# Patient Record
Sex: Female | Born: 1960 | Race: Black or African American | Hispanic: No | Marital: Married | Smoking: Current every day smoker
Health system: Southern US, Community
[De-identification: ages and names within clinical notes are randomized; demographics above are authoritative.]

## PROBLEM LIST (undated history)

## (undated) DIAGNOSIS — M1990 Unspecified osteoarthritis, unspecified site: Secondary | ICD-10-CM

## (undated) HISTORY — PX: APPENDECTOMY: SHX54

## (undated) HISTORY — PX: ABDOMINAL HYSTERECTOMY: SHX81

## (undated) HISTORY — PX: COLONOSCOPY: SHX174

## (undated) HISTORY — DX: Unspecified osteoarthritis, unspecified site: M19.90

---

## 2014-05-10 ENCOUNTER — Other Ambulatory Visit: Payer: Self-pay | Admitting: Obstetrics and Gynecology

## 2014-05-10 DIAGNOSIS — Z1231 Encounter for screening mammogram for malignant neoplasm of breast: Secondary | ICD-10-CM

## 2014-05-25 ENCOUNTER — Encounter (INDEPENDENT_AMBULATORY_CARE_PROVIDER_SITE_OTHER): Payer: Self-pay

## 2014-05-25 ENCOUNTER — Ambulatory Visit
Admission: RE | Admit: 2014-05-25 | Discharge: 2014-05-25 | Disposition: A | Discharge status: Home / Self Care | Payer: PRIVATE HEALTH INSURANCE | Source: Ambulatory Visit | Attending: Obstetrics and Gynecology | Admitting: Obstetrics and Gynecology

## 2014-05-25 DIAGNOSIS — Z1231 Encounter for screening mammogram for malignant neoplasm of breast: Secondary | ICD-10-CM

## 2015-08-22 ENCOUNTER — Encounter (HOSPITAL_COMMUNITY): Payer: Self-pay | Admitting: Emergency Medicine

## 2015-08-22 ENCOUNTER — Ambulatory Visit (HOSPITAL_COMMUNITY)
Admission: EM | Admit: 2015-08-22 | Discharge: 2015-08-22 | Disposition: A | Discharge status: Home / Self Care | Payer: PRIVATE HEALTH INSURANCE | Attending: Family Medicine | Admitting: Family Medicine

## 2015-08-22 DIAGNOSIS — H0014 Chalazion left upper eyelid: Secondary | ICD-10-CM

## 2015-08-22 MED ORDER — ERYTHROMYCIN 5 MG/GM OP OINT: TOPICAL_OINTMENT | OPHTHALMIC | Status: DC

## 2015-08-22 NOTE — Discharge Instructions (Signed)
Chalazion A chalazion is a swelling or lump on the eyelid. It can affect the upper or lower eyelid. CAUSES This condition may be caused by:  Long-lasting (chronic) inflammation of the eyelid glands.  A blocked oil gland in the eyelid. SYMPTOMS Symptoms of this condition include:  A swelling on the eyelid. The swelling may spread to areas around the eye.  A hard lump on the eyelid. This lump may make it hard to see out of the eye. DIAGNOSIS This condition is diagnosed with an examination of the eye. TREATMENT This condition is treated by applying a warm compress to the eyelid. If the condition does not improve after two days, it may be treated with:  Surgery.  Medicine that is injected into the chalazion by a health care provider.  Medicine that is applied to the eye. HOME CARE INSTRUCTIONS  Do not touch the chalazion.  Do not try to remove the pus, such as by squeezing the chalazion or sticking it with a pin or needle.  Do not rub your eyes.  Wash your hands often. Dry your hands with a clean towel.  Keep your face, scalp, and eyebrows clean.  Avoid wearing eye makeup.  Apply a warm, moist compress to the eyelid 4-6 times a day for 10-15 minutes at a time. This will help to open any blocked glands and help to reduce redness and swelling.  Apply over-the-counter and prescription medicines only as told by your health care provider.  If the chalazion does not break open (rupture) on its own in a month, return to your health care provider.  Keep all follow-up appointments as told by your health care provider. This is important. SEEK MEDICAL CARE IF:  Your eyelid has not improved in 4 weeks.  Your eyelid is getting worse.  You have a fever.  The chalazion does not rupture on its own with home treatment in a month. SEEK IMMEDIATE MEDICAL CARE IF:  You have pain in your eye.  Your vision changes.  The chalazion becomes painful or red  The chalazion gets  bigger.   This information is not intended to replace advice given to you by your health care provider. Make sure you discuss any questions you have with your health care provider.   Document Released: 03/22/2000 Document Revised: 12/14/2014 Document Reviewed: 07/18/2014 Elsevier Interactive Patient Education [employer].

## 2015-08-22 NOTE — ED Notes (Signed)
The patient presented to the [facility] with a complaint of left eye drainage and pain x 4 days. The patient stated that she did not injure her eye and just woke up with it irritated and swollen. The patient stated that she has tried eye drops as well as having her eye flushed at work with no relief.

## 2015-08-23 NOTE — ED Provider Notes (Signed)
CSN: [id number]     Arrival date & time 08/22/15  1952 History   First MD Initiated Contact with Patient 08/22/15 2026     Chief Complaint  Patient presents with  . Eye Drainage   (Consider location/radiation/quality/duration/timing/severity/associated sxs/prior Treatment) HPI History obtained from patient:  Pt presents with the cc [id number] EYE PAIN Duration of symptoms:2 DAYS Treatment prior to arrival:COLD COMPRESSES Context:RUBBING EYE NOW WITH PAIN, REDNESS, AND TEARING. SOME BLURRED VISION, DUE TO PAIN.  Other symptoms include: HEADACHE Pain score:4 FAMILY HISTORY:NO FAMILY HISTORY OF CANCER    History reviewed. No pertinent past medical history. Past Surgical History  Procedure Laterality Date  . Abdominal hysterectomy     History reviewed. No pertinent family history. Social History  Substance Use Topics  . Smoking status: Current Every Day Smoker -- 0.50 packs/day for 20 years    Types: Cigarettes  . Smokeless tobacco: None  . Alcohol Use: No   OB History    No data available     Review of Systems  Denies:  NAUSEA, ABDOMINAL PAIN, CHEST PAIN, CONGESTION, DYSURIA, SHORTNESS OF BREATH  Allergies  Review of patient's allergies indicates no known allergies.  Home Medications   Prior to Admission medications   Medication Sig Start Date End Date Taking? Authorizing Provider  erythromycin ophthalmic ointment Place a 1/2 inch ribbon of ointment into the lower eyelid. 08/22/15   Konrad Felix, PA   Meds Ordered and Administered this Visit  Medications - No data to display  BP 120/80 mmHg  Pulse 73  Temp(Src) 98.7 F (37.1 C) (Tympanic)  Resp 16  SpO2 100% No data found.   Physical Exam NURSES NOTES AND VITAL SIGNS REVIEWED. CONSTITUTIONAL: Well developed, well nourished, no acute distress HEENT: normocephalic, atraumatic EYES: Conjunctiva normal, LEFT EYE UPPER LID INTERNAL STYE NOTED. TENDER TO PALPATION, NO FORMAL PUS SACK NOTED. BEDSIDE SNELLEN CHART  IS 20/40 NECK:normal ROM, supple, no adenopathy PULMONARY:No respiratory distress, normal effort ABDOMINAL: Soft, ND, NT BS+, No CVAT MUSCULOSKELETAL: Normal ROM of all extremities,  SKIN: warm and dry without rash PSYCHIATRIC: Mood and affect, behavior are normal  ED Course  Procedures (including critical care time)  Labs Review Labs Reviewed - No data to display  Imaging Review No results found.   Visual Acuity Review  Right Eye Distance: 20/40 Left Eye Distance: Could not see chart Bilateral Distance: 20/40  Right Eye Near:   Left Eye Near:    Bilateral Near:      RX ERYTHROMYCIN OINTMENT  SLIT LAMP NOT NEEDED.  MDM   1. Chalazion of left upper eyelid     Patient is reassured that there are no issues that require transfer to higher level of care at this time or additional tests. Patient is advised to continue home symptomatic treatment. Patient is advised that if there are new or worsening symptoms to attend the emergency department, contact primary care provider, or return to UC. Instructions of care provided discharged home in stable condition.    THIS NOTE WAS GENERATED USING A VOICE RECOGNITION SOFTWARE PROGRAM. ALL REASONABLE EFFORTS  WERE MADE TO PROOFREAD THIS DOCUMENT FOR ACCURACY.  I have verbally reviewed the discharge instructions with the patient. A printed AVS was given to the patient.  All questions were answered prior to discharge.      Konrad Felix, [place] 08/23/15 [phone]

## 2016-03-01 ENCOUNTER — Emergency Department (HOSPITAL_COMMUNITY): Payer: PRIVATE HEALTH INSURANCE

## 2016-03-01 ENCOUNTER — Encounter (HOSPITAL_COMMUNITY): Payer: Self-pay

## 2016-03-01 ENCOUNTER — Emergency Department (HOSPITAL_COMMUNITY)
Admission: EM | Admit: 2016-03-01 | Discharge: 2016-03-01 | Disposition: A | Discharge status: Home / Self Care | Payer: PRIVATE HEALTH INSURANCE | Attending: Emergency Medicine | Admitting: Emergency Medicine

## 2016-03-01 DIAGNOSIS — J069 Acute upper respiratory infection, unspecified: Secondary | ICD-10-CM | POA: Insufficient documentation

## 2016-03-01 DIAGNOSIS — J309 Allergic rhinitis, unspecified: Secondary | ICD-10-CM

## 2016-03-01 DIAGNOSIS — B9789 Other viral agents as the cause of diseases classified elsewhere: Secondary | ICD-10-CM

## 2016-03-01 DIAGNOSIS — F17210 Nicotine dependence, cigarettes, uncomplicated: Secondary | ICD-10-CM | POA: Insufficient documentation

## 2016-03-01 MED ORDER — FLUTICASONE PROPIONATE 50 MCG/ACT NA SUSP: 2.0000 | Freq: Every day | NASAL | 0 refills | Status: DC

## 2016-03-01 MED ORDER — CETIRIZINE HCL 10 MG PO TABS: 10.0000 mg | ORAL_TABLET | Freq: Every day | ORAL | 1 refills | Status: DC

## 2016-03-01 MED ORDER — NAPROXEN 250 MG PO TABS: 250.0000 mg | ORAL_TABLET | Freq: Two times a day (BID) | ORAL | 0 refills | Status: DC

## 2016-03-01 MED ORDER — BENZONATATE 100 MG PO CAPS
100.0000 mg | ORAL_CAPSULE | Freq: Three times a day (TID) | ORAL | 0 refills | Status: DC | PRN
Start: 2016-03-01 — End: 2017-04-22

## 2016-03-01 NOTE — ED Triage Notes (Signed)
Pt reports recent cold with cough symptoms. Pt reports of pain in her back and into her rib cage. Pt reports persistent cough. Voice is hoarse.

## 2016-03-01 NOTE — ED Provider Notes (Signed)
[facility] DEPT Provider Note    By signing my name below, I, Bea Graff, attest that this documentation has been prepared under the direction and in the presence of Will Antia Rahal, PA-C. Electronically Signed: Bea Graff, ED Scribe. 03/01/16. 10:45 AM.    History   Chief Complaint Chief Complaint  Patient presents with  . URI    The history is provided by the patient and medical records. No language interpreter was used.    HPI Comments:  Krystal Rogers is a 55 y.o. female who presents to the Emergency Department complaining of URI symptoms that began two weeks ago. She reports associated hoarseness, sore throat, post nasal drip, cough, wheezing, sneezing, rhinorrhea, chills and myalgias around her ribs when coughing. She denies any known sick contacts. She has taken Mucinex, last dose last night, for her symptoms with no significant relief. Deep breathing and coughing increases the myalgias. She denies alleviating factors.  She denies fever, difficulty breathing or swallowing, CP, otalgia, abdominal pain, nausea, vomiting.   History reviewed. No pertinent past medical history.  There are no active problems to display for this patient.   Past Surgical History:  Procedure Laterality Date  . ABDOMINAL HYSTERECTOMY      OB History    No data available       Home Medications    Prior to Admission medications   Medication Sig Start Date End Date Taking? Authorizing Provider  benzonatate (TESSALON) 100 MG capsule Take 1 capsule (100 mg total) by mouth 3 (three) times daily as needed for cough. 03/01/16   Waynetta Pean, PA-C  cetirizine (ZYRTEC ALLERGY) 10 MG tablet Take 1 tablet (10 mg total) by mouth daily. 03/01/16   Waynetta Pean, PA-C  erythromycin ophthalmic ointment Place a 1/2 inch ribbon of ointment into the lower eyelid. 08/22/15   Konrad Felix, PA  fluticasone (FLONASE) 50 MCG/ACT nasal spray Place 2 sprays into both nostrils daily.  03/01/16   Waynetta Pean, PA-C  naproxen (NAPROSYN) 250 MG tablet Take 1 tablet (250 mg total) by mouth 2 (two) times daily with a meal. 03/01/16   Waynetta Pean, PA-C    Family History History reviewed. No pertinent family history.  Social History Social History  Substance Use Topics  . Smoking status: Current Every Day Smoker    Packs/day: 0.50    Years: 20.00    Types: Cigarettes  . Smokeless tobacco: Never Used  . Alcohol use No     Allergies   Patient has no known allergies.   Review of Systems Review of Systems  Constitutional: Positive for chills. Negative for fever.  HENT: Positive for congestion, postnasal drip, rhinorrhea, sneezing, sore throat and voice change. Negative for ear pain and trouble swallowing.   Eyes: Negative for visual disturbance.  Respiratory: Positive for cough. Negative for shortness of breath and wheezing.   Cardiovascular: Negative for chest pain and palpitations.  Gastrointestinal: Negative for abdominal pain, nausea and vomiting.  Musculoskeletal: Positive for myalgias. Negative for neck pain and neck stiffness.  Skin: Negative for rash.  Neurological: Negative for light-headedness and headaches.     Physical Exam Updated Vital Signs BP (!) 140/116 (BP Location: Left Arm)   Pulse 80   Temp 98.1 F (36.7 C) (Oral)   Resp 19   Ht 5\' 3"  (1.6 m)   Wt 123 lb 6.4 oz (56 kg)   SpO2 100%   BMI 21.86 kg/m   Physical Exam  Constitutional: She is oriented to person, place, and  time. She appears well-developed and well-nourished. No distress.  Nontoxic appearing.  HENT:  Head: Normocephalic and atraumatic.  Right Ear: External ear normal.  Left Ear: External ear normal.  Nose: Mucosal edema present.  Mouth/Throat: No oropharyngeal exudate.  Very boggy nasal turbinates bilaterally. No tonsillar hypertrophy or exudates. Post nasal drip present.  Eyes: Conjunctivae are normal. Pupils are equal, round, and reactive to light. Right eye  exhibits no discharge. Left eye exhibits no discharge.  Neck: Normal range of motion. Neck supple. No JVD present.  Cardiovascular: Normal rate, regular rhythm, normal heart sounds and intact distal pulses.   Pulmonary/Chest: Effort normal and breath sounds normal. No stridor. No respiratory distress. She has no wheezes. She has no rales.  Lungs are clear to auscultation bilaterally. No increased work of breathing. No rales or rhonchi.  Abdominal: Soft. There is no tenderness.  Musculoskeletal: She exhibits no edema.  Lymphadenopathy:    She has no cervical adenopathy.  Neurological: She is alert and oriented to person, place, and time. Coordination normal.  Skin: Skin is warm and dry. Capillary refill takes less than 2 seconds. No rash noted. She is not diaphoretic. No erythema. No pallor.  Psychiatric: She has a normal mood and affect. Her behavior is normal.  Nursing note and vitals reviewed.    ED Treatments / Results  DIAGNOSTIC STUDIES: Oxygen Saturation is 100% on RA, normal by my interpretation.   COORDINATION OF CARE: 10:04 AM- Will order CXR. Pt verbalizes understanding and agrees to plan.  Medications - No data to display  Labs (all labs ordered are listed, but only abnormal results are displayed) Labs Reviewed - No data to display  EKG  EKG Interpretation None       Radiology Dg Chest 2 View  Result Date: 03/01/2016 CLINICAL DATA:  Coughing for 2 weeks. EXAM: CHEST  2 VIEW COMPARISON:  None. FINDINGS: The heart size and mediastinal contours are within normal limits. Both lungs are clear. The visualized skeletal structures are unremarkable. IMPRESSION: No active cardiopulmonary disease. Electronically Signed   By: Fidela Salisbury M.D.   On: 03/01/2016 10:39    Procedures Procedures (including critical care time)  Medications Ordered in ED Medications - No data to display   Initial Impression / Assessment and Plan / ED Course  I have reviewed the  triage vital signs and the nursing notes.  Pertinent labs & imaging results that were available during my care of the patient were reviewed by me and considered in my medical decision making (see chart for details).  Clinical Course    This  is a 55 y.o. female who presents to the Emergency Department complaining of URI symptoms that began two weeks ago. She reports associated hoarseness, sore throat, post nasal drip, cough, wheezing, sneezing, rhinorrhea, chills and myalgias around her ribs when coughing. No fevers, chest pain or shortness of breath. On exam the patient is afebrile and nontoxic appearing. Lungs are clear to auscultation bilaterally. Evidence of URI. Pt symptoms consistent with URI symptoms that began two weeks ago. CXR negative for acute infiltrate. Pt will be discharged with symptomatic treatment.  Discussed return precautions.  Pt is hemodynamically stable & in NAD prior to discharge. I advised the patient to follow-up with their primary care provider this week. I advised the patient to return to the emergency department with new or worsening symptoms or new concerns. The patient verbalized understanding and agreement with plan.      I personally performed the services described in  this documentation, which was scribed in my presence. The recorded information has been reviewed and is accurate.     Final Clinical Impressions(s) / ED Diagnoses   Final diagnoses:  Viral URI with cough  Acute allergic rhinitis, unspecified seasonality, unspecified trigger    New Prescriptions New Prescriptions   BENZONATATE (TESSALON) 100 MG CAPSULE    Take 1 capsule (100 mg total) by mouth 3 (three) times daily as needed for cough.   CETIRIZINE (ZYRTEC ALLERGY) 10 MG TABLET    Take 1 tablet (10 mg total) by mouth daily.   FLUTICASONE (FLONASE) 50 MCG/ACT NASAL SPRAY    Place 2 sprays into both nostrils daily.   NAPROXEN (NAPROSYN) 250 MG TABLET    Take 1 tablet (250 mg total) by mouth 2  (two) times daily with a meal.     Waynetta Pean, PA-C 03/01/16 [place], MD 03/02/16 [phone]

## 2017-03-28 ENCOUNTER — Encounter (HOSPITAL_COMMUNITY): Payer: Self-pay | Admitting: Emergency Medicine

## 2017-03-28 ENCOUNTER — Ambulatory Visit (HOSPITAL_COMMUNITY)
Admission: EM | Admit: 2017-03-28 | Discharge: 2017-03-28 | Disposition: A | Discharge status: Home / Self Care | Payer: PRIVATE HEALTH INSURANCE | Attending: Internal Medicine | Admitting: Internal Medicine

## 2017-03-28 ENCOUNTER — Telehealth (HOSPITAL_COMMUNITY): Payer: Self-pay | Admitting: Emergency Medicine

## 2017-03-28 DIAGNOSIS — M25561 Pain in right knee: Secondary | ICD-10-CM | POA: Diagnosis not present

## 2017-03-28 MED ORDER — NAPROXEN 250 MG PO TABS: 250.0000 mg | ORAL_TABLET | Freq: Two times a day (BID) | ORAL | 0 refills | Status: DC

## 2017-03-28 NOTE — Discharge Instructions (Signed)
Please take naproxen twice a day, take with food. May take 500mg  at night if pain more severe. Ice and elevate knee, especially after work. Knee sleeve for support, especially while at work. Please see provided exercises that may help strengthen knee. Please follow up with primary care or with orthopedics if pain persists and/or worsens in the next 2-4 weeks.

## 2017-03-28 NOTE — Telephone Encounter (Signed)
Per Janee Morn, NP.... Ok to call in Naproxen 250 MG BID  #14 x7 days no refills.   Sent to [facility])

## 2017-03-28 NOTE — ED Triage Notes (Signed)
PT C/O: intermittent right knee pain ... Reports its getting worse .... Pain increases w/activity   ONSET: 2 weeks   DENIES: inj/trauma  TAKING MEDS: none   A&O x4... NAD... Ambulatory

## 2017-03-28 NOTE — ED Provider Notes (Signed)
[place]    CSN: [id number] Arrival date & time: 03/28/17  1022     History   Chief Complaint Chief Complaint  Patient presents with  . Knee Pain    HPI Krystal Rogers is a 56 y.o. female.   Krystal Rogers presents with complaints of right knee pain which has worsened over the past few days. This has been ongoing for a few weeks. She works on her feet which does seem to exacerbate the pain. Pain worse this morning when she woke, rated it 10/10, now is a 7/10. Is sharp. Worse with knee flexion. No known injury to the knee. She does take aleve approximately once a day which seems to help. She had a tibial fracture as a child with screws placed. Has not since followed with orthopedics or had any other known injuries or surgeries to the knee. Does not take any medications regularly.   ROS per HPI.       History reviewed. No pertinent past medical history.  There are no active problems to display for this patient.   Past Surgical History:  Procedure Laterality Date  . ABDOMINAL HYSTERECTOMY      OB History    No data available       Home Medications    Prior to Admission medications   Medication Sig Start Date End Date Taking? Authorizing Provider  benzonatate (TESSALON) 100 MG capsule Take 1 capsule (100 mg total) by mouth 3 (three) times daily as needed for cough. 03/01/16   Waynetta Pean, PA-C  cetirizine (ZYRTEC ALLERGY) 10 MG tablet Take 1 tablet (10 mg total) by mouth daily. 03/01/16   Waynetta Pean, PA-C  erythromycin ophthalmic ointment Place a 1/2 inch ribbon of ointment into the lower eyelid. 08/22/15   Konrad Felix, PA  fluticasone (FLONASE) 50 MCG/ACT nasal spray Place 2 sprays into both nostrils daily. 03/01/16   Waynetta Pean, PA-C  naproxen (NAPROSYN) 250 MG tablet Take 1 tablet (250 mg total) by mouth 2 (two) times daily with a meal. 03/01/16   Waynetta Pean, PA-C    Family History History reviewed. No pertinent family  history.  Social History Social History   Tobacco Use  . Smoking status: Current Every Day Smoker    Packs/day: 0.50    Years: 20.00    Pack years: 10.00    Types: Cigarettes  . Smokeless tobacco: Never Used  Substance Use Topics  . Alcohol use: No  . Drug use: Not on file     Allergies   Madelaine Bhat isothiocyanate]   Review of Systems Review of Systems   Physical Exam Triage Vital Signs ED Triage Vitals  Enc Vitals Group     BP 03/28/17 1103 103/60     Pulse Rate 03/28/17 1103 76     Resp 03/28/17 1103 18     Temp 03/28/17 1103 98.3 F (36.8 C)     Temp Source 03/28/17 1103 Oral     SpO2 03/28/17 1103 100 %     Weight --      Height --      Head Circumference --      Peak Flow --      Pain Score 03/28/17 1101 8     Pain Loc --      Pain Edu? --      Excl. in [place]? --    No data found.  Updated Vital Signs BP 103/60 (BP Location: Left Arm)   Pulse 76   Temp  98.3 F (36.8 C) (Oral)   Resp 18   SpO2 100%   Visual Acuity Right Eye Distance:   Left Eye Distance:   Bilateral Distance:    Right Eye Near:   Left Eye Near:    Bilateral Near:     Physical Exam  Constitutional: She is oriented to person, place, and time. She appears well-developed and well-nourished. No distress.  Cardiovascular: Normal rate, regular rhythm and normal heart sounds.  Pulmonary/Chest: Effort normal and breath sounds normal.  Musculoskeletal:       Right knee: She exhibits normal range of motion, no swelling, no effusion, no ecchymosis, no deformity, no laceration, no erythema, normal alignment, no LCL laxity, normal patellar mobility and no bony tenderness. Tenderness found. Medial joint line and MCL tenderness noted.  Pain with complete passive flexion at medial joint; tenderness to MCL at tibial insertion; pain with MCL tension; equal strength to bilateral lower extremities; sensation intact; ambulatory  Neurological: She is alert and oriented to person, place, and time.   Skin: Skin is warm and dry.     UC Treatments / Results  Labs (all labs ordered are listed, but only abnormal results are displayed) Labs Reviewed - No data to display  EKG  EKG Interpretation None       Radiology No results found.  Procedures Procedures (including critical care time)  Medications Ordered in UC Medications - No data to display   Initial Impression / Assessment and Plan / UC Course  I have reviewed the triage vital signs and the nursing notes.  Pertinent labs & imaging results that were available during my care of the patient were reviewed by me and considered in my medical decision making (see chart for details).     Knee pain likely related to MCL based on exam. Continue with naproxen, knee sleeve. Follow up with orthopedics and/or PCP as needed for persistent or worsening of symptoms. Patient verbalized understanding and agreeable to plan.  Ambulatory out of clinic without difficulty.    Final Clinical Impressions(s) / UC Diagnoses   Final diagnoses:  Right knee pain, unspecified chronicity    ED Discharge Orders    None       Controlled Substance Prescriptions  Controlled Substance Registry consulted? Not Applicable   Zigmund Gottron, NP 03/28/17 1206

## 2017-04-22 ENCOUNTER — Encounter: Payer: Self-pay | Admitting: Nurse Practitioner

## 2017-04-22 ENCOUNTER — Ambulatory Visit: Payer: PRIVATE HEALTH INSURANCE | Attending: Nurse Practitioner | Admitting: Nurse Practitioner

## 2017-04-22 VITALS — BP 109/72 | HR 79 | Temp 98.3°F | Ht 63.0 in | Wt 119.8 lb

## 2017-04-22 DIAGNOSIS — Z79899 Other long term (current) drug therapy: Secondary | ICD-10-CM | POA: Insufficient documentation

## 2017-04-22 DIAGNOSIS — Z1231 Encounter for screening mammogram for malignant neoplasm of breast: Secondary | ICD-10-CM | POA: Diagnosis not present

## 2017-04-22 DIAGNOSIS — Z1211 Encounter for screening for malignant neoplasm of colon: Secondary | ICD-10-CM

## 2017-04-22 DIAGNOSIS — R238 Other skin changes: Secondary | ICD-10-CM | POA: Insufficient documentation

## 2017-04-22 DIAGNOSIS — R634 Abnormal weight loss: Secondary | ICD-10-CM | POA: Insufficient documentation

## 2017-04-22 DIAGNOSIS — M25561 Pain in right knee: Secondary | ICD-10-CM

## 2017-04-22 DIAGNOSIS — Z0000 Encounter for general adult medical examination without abnormal findings: Secondary | ICD-10-CM | POA: Diagnosis not present

## 2017-04-22 MED ORDER — DICLOFENAC SODIUM 1 % TD GEL: 2.0000 g | Freq: Four times a day (QID) | TRANSDERMAL | 0 refills | Status: DC

## 2017-04-22 NOTE — Patient Instructions (Addendum)

## 2017-04-22 NOTE — Progress Notes (Signed)
Assessment & Plan:  Krystal Rogers was seen today for hospitalization follow-up.  Diagnoses and all orders for this visit:  Acute pain of right knee -     diclofenac sodium (VOLTAREN) 1 % GEL; Apply 2 g topically 4 (four) times daily. RICE-rest limb as often as possible Ice, compression and elevation for swelling  Other skin changes -     Ambulatory referral to Dermatology  Routine adult health maintenance -     CBC -     Basic metabolic panel -     Lipid panel -     VITAMIN D 25 Hydroxy (Vit-D Deficiency, Fractures)  Weight loss -     TSH  Colon cancer screening -     Ambulatory referral to Gastroenterology  Breast cancer screening by mammogram -     MM DIGITAL SCREENING BILATERAL; Future    Patient has been counseled on age-appropriate routine health concerns for screening and prevention. These are reviewed and up-to-date. Referrals have been placed accordingly. Immunizations are up-to-date or declined.    Subjective:   Chief Complaint  Patient presents with  . Hospitalization Follow-up    Patient is here for hospitalization for right knee pain . Patient stated it throbs and ache more at night when she sleep. Patient stated the pain been going on for a month already. Patient stated after she was admitted from the hospital, her knee pain got better.    HPI Krystal Rogers 57 y.o. female presents to office today to establish care as a new patient and for follow up to right knee pain.   Right Knee Pain Seen at the Urgent Care for right knee pain on 03-28-2017. At that time knee pain had been ongoing for a few weeks. No imaging was performed as patient did not report any recent injury to her knee. She was prescribed Naproxen and referred to Orthopedics and instructed to obtain a PCP as well.  Pain was sudden onset with swelling. Pain is rated as 4/10 during the day and 8/10 at night when she is lying in the bed. She denies any injury to knee. Current symptoms include pain  located behind the patellar area, stiffness and swelling. Pain is aggravated by driving (pushing pedals in the car; flexion).  Treatment to date:icy hot and arthritis cream with little to no relief of symptoms.   Weight Loss She has lost about 20 lbs since this summer. She also reports very poor dentition with heat and cold sensitivity. She also lost several teeth over the summer and a few months ago had the remainder of her teeth pulled. She is currently waiting for her dentures. She has had to eliminate many of the foods she loves to eat due to not being able to chew properly. This may likely be the cause of her weight loss. She denies any loss of appetite. Will monitor for now and order labs today.    Review of Systems  Constitutional: Positive for weight loss. Negative for fever and malaise/fatigue.  HENT: Negative.  Negative for nosebleeds.   Eyes: Negative.  Negative for blurred vision, double vision and photophobia.  Respiratory: Negative.  Negative for cough and shortness of breath.   Cardiovascular: Negative.  Negative for chest pain, palpitations and leg swelling.  Gastrointestinal: Negative.  Negative for abdominal pain, constipation, diarrhea, heartburn, nausea and vomiting.  Musculoskeletal: Positive for joint pain. Negative for myalgias.       See hpi  Skin:       See hpi  Neurological: Negative.  Negative for dizziness, focal weakness, seizures, weakness and headaches.  Endo/Heme/Allergies: Negative for environmental allergies.  Psychiatric/Behavioral: Negative.  Negative for suicidal ideas.    History reviewed. No pertinent past medical history.  Past Surgical History:  Procedure Laterality Date  . ABDOMINAL HYSTERECTOMY      Family History  Problem Relation Age of Onset  . Cancer Father   . Cancer Brother     Social History Reviewed with no changes to be made today.   Outpatient Medications Prior to Visit  Medication Sig Dispense Refill  . cetirizine (ZYRTEC  ALLERGY) 10 MG tablet Take 1 tablet (10 mg total) by mouth daily. (Patient not taking: Reported on 04/22/2017) 30 tablet 1  . erythromycin ophthalmic ointment Place a 1/2 inch ribbon of ointment into the lower eyelid. (Patient not taking: Reported on 04/22/2017) 3.5 g 1  . fluticasone (FLONASE) 50 MCG/ACT nasal spray Place 2 sprays into both nostrils daily. (Patient not taking: Reported on 04/22/2017) 16 g 0  . benzonatate (TESSALON) 100 MG capsule Take 1 capsule (100 mg total) by mouth 3 (three) times daily as needed for cough. (Patient not taking: Reported on 04/22/2017) 21 capsule 0  . naproxen (NAPROSYN) 250 MG tablet Take 1 tablet (250 mg total) by mouth 2 (two) times daily with a meal. (Patient not taking: Reported on 04/22/2017) 14 tablet 0   No facility-administered medications prior to visit.     Allergies  Allergen Reactions  . Madelaine Bhat Isothiocyanate] Hives       Objective:    BP 109/72 (BP Location: Left Arm, Patient Position: Sitting, Cuff Size: Normal)   Pulse 79   Temp 98.3 F (36.8 C) (Oral)   Ht 5\' 3"  (1.6 m)   Wt 119 lb 12.8 oz (54.3 kg)   SpO2 99%   BMI 21.22 kg/m  Wt Readings from Last 3 Encounters:  04/22/17 119 lb 12.8 oz (54.3 kg)  03/01/16 123 lb 6.4 oz (56 kg)    Physical Exam  Constitutional: She is oriented to person, place, and time. She appears well-developed and well-nourished. She is cooperative.  HENT:  Head: Normocephalic and atraumatic.  Eyes: EOM are normal.  Neck: Normal range of motion.  Cardiovascular: Normal rate, regular rhythm, normal heart sounds and intact distal pulses. Exam reveals no gallop and no friction rub.  No murmur heard. Pulmonary/Chest: Effort normal and breath sounds normal. No tachypnea. No respiratory distress. She has no decreased breath sounds. She has no wheezes. She has no rhonchi. She has no rales. She exhibits no tenderness.  Abdominal: Soft. Bowel sounds are normal.  Musculoskeletal: Normal range of motion. She  exhibits no edema, tenderness or deformity.  Neurological: She is alert and oriented to person, place, and time. Coordination normal.  Skin: Skin is warm and dry.     Skin changes  Psychiatric: She has a normal mood and affect. Her behavior is normal. Judgment and thought content normal.  Nursing note and vitals reviewed.     Patient has been counseled extensively about nutrition and exercise as well as the importance of adherence with medications and regular follow-up. The patient was given clear instructions to go to ER or return to medical center if symptoms don't improve, worsen or new problems develop. The patient verbalized understanding.   Follow-up: Return in about 2 weeks (around 05/06/2017) for schedule for pap and physical .   Gildardo Pounds, FNP-BC [facility] [place], [place]   04/22/2017,  11:15 AM

## 2017-04-23 ENCOUNTER — Telehealth: Payer: Self-pay

## 2017-04-23 LAB — BASIC METABOLIC PANEL
BUN / CREAT RATIO: 17 (ref 9–23)
BUN: 12 mg/dL (ref 6–24)
CO2: 22 mmol/L (ref 20–29)
CREATININE: 0.7 mg/dL (ref 0.57–1.00)
Calcium: 9.2 mg/dL (ref 8.7–10.2)
Chloride: 105 mmol/L (ref 96–106)
GFR calc non Af Amer: 99 mL/min/{1.73_m2} (ref >59)
GFR, EST AFRICAN AMERICAN: 114 mL/min/{1.73_m2} (ref >59)
GLUCOSE: 79 mg/dL (ref 65–99)
Potassium: 4.5 mmol/L (ref 3.5–5.2)
SODIUM: 142 mmol/L (ref 134–144)

## 2017-04-23 LAB — CBC
HEMOGLOBIN: 14.7 g/dL (ref 11.1–15.9)
Hematocrit: 45.2 % (ref 34.0–46.6)
MCH: 28.7 pg (ref 26.6–33.0)
MCHC: 32.5 g/dL (ref 31.5–35.7)
MCV: 88 fL (ref 79–97)
PLATELETS: 249 10*3/uL (ref 150–379)
RBC: 5.12 x10E6/uL (ref 3.77–5.28)
RDW: 13.5 % (ref 12.3–15.4)
WBC: 9.9 10*3/uL (ref 3.4–10.8)

## 2017-04-23 LAB — VITAMIN D 25 HYDROXY (VIT D DEFICIENCY, FRACTURES): Vit D, 25-Hydroxy: 24.1 ng/mL — ABNORMAL LOW (ref 30.0–100.0)

## 2017-04-23 LAB — LIPID PANEL
Chol/HDL Ratio: 4 ratio (ref 0.0–4.4)
Cholesterol, Total: 186 mg/dL (ref 100–199)
HDL: 46 mg/dL (ref >39)
LDL CALC: 127 mg/dL — AB (ref 0–99)
Triglycerides: 65 mg/dL (ref 0–149)
VLDL CHOLESTEROL CAL: 13 mg/dL (ref 5–40)

## 2017-04-23 LAB — SPECIMEN STATUS REPORT

## 2017-04-23 LAB — TSH: TSH: 0.906 u[IU]/mL (ref 0.450–4.500)

## 2017-04-23 NOTE — Telephone Encounter (Signed)
-----   Message from Gildardo Pounds, NP sent at 04/23/2017  9:00 AM EST ----- LDL which is a form of cholesterol is slightly elevated. Work on a low fat, heart healthy diet and participate in regular aerobic exercise program to control as well by working out at least 150 minutes per week. No fried foods. No junk foods, sodas, sugary drinks, unhealthy snacking, or smoking. Also your TSH which is your thyroid hormone indicator is slightly low normal; I am ordering additional blood work for evaluation. Vitamin D is below normal. Please make sure you take at least 2000units of vitamin D daily over the counter.

## 2017-04-23 NOTE — Addendum Note (Signed)
Addended by: Geryl Rankins on: 04/23/2017 09:02 AM   Modules accepted: Orders

## 2017-04-24 ENCOUNTER — Ambulatory Visit (INDEPENDENT_AMBULATORY_CARE_PROVIDER_SITE_OTHER): Payer: No Typology Code available for payment source | Admitting: Orthopaedic Surgery

## 2017-04-24 ENCOUNTER — Ambulatory Visit (INDEPENDENT_AMBULATORY_CARE_PROVIDER_SITE_OTHER): Payer: No Typology Code available for payment source

## 2017-04-24 DIAGNOSIS — M23203 Derangement of unspecified medial meniscus due to old tear or injury, right knee: Secondary | ICD-10-CM

## 2017-04-24 LAB — T3
T3 TOTAL: 124 ng/dL (ref 71–180)
T3, Total: 124 ng/dL (ref 71–180)

## 2017-04-24 LAB — T4, FREE
FREE T4: 1.2 ng/dL (ref 0.82–1.77)
Free T4: 1.2 ng/dL (ref 0.82–1.77)

## 2017-04-24 LAB — SPECIMEN STATUS REPORT

## 2017-04-24 MED ORDER — METHYLPREDNISOLONE ACETATE 40 MG/ML IJ SUSP
40.0000 mg | INTRAMUSCULAR | Status: AC | PRN
  Administered 2017-04-24: 40 mg via INTRA_ARTICULAR

## 2017-04-24 MED ORDER — LIDOCAINE HCL 1 % IJ SOLN
2.0000 mL | INTRAMUSCULAR | Status: AC | PRN
  Administered 2017-04-24: 2 mL

## 2017-04-24 MED ORDER — BUPIVACAINE HCL 0.25 % IJ SOLN
2.0000 mL | INTRAMUSCULAR | Status: AC | PRN
  Administered 2017-04-24: 2 mL via INTRA_ARTICULAR

## 2017-04-24 NOTE — Progress Notes (Signed)
Office Visit Note   Patient: Krystal Rogers           Date of Birth: 27-Jun-1960           MRN: [id number] Visit Date: 04/24/2017              Requested by: No referring provider defined for this encounter. PCP: Gildardo Pounds, NP   Assessment & Plan: Visit Diagnoses:  1. Degenerative tear of medial meniscus of right knee     Plan: Impression is right knee degenerative medial meniscus tear.  At this point I feel is appropriate to proceed with a right knee intra-articular cortisone injection to see if we get some relief of pain.  If she is not any better, she will call us in 2 weeks time and we will get an MRI to assess her medial meniscus.  Follow-Up Instructions: Return if symptoms worsen or fail to improve.   Orders:  Orders Placed This Encounter  Procedures  . Large Joint Inj: R knee  . XR Knee 1-2 Views Right   No orders of the defined types were placed in this encounter.     Procedures: Large Joint Inj: R knee on 04/24/2017 9:35 AM Indications: pain Details: 22 G needle, anterolateral approach Medications: 2 mL lidocaine 1 %; 2 mL bupivacaine 0.25 %; 40 mg methylPREDNISolone acetate 40 MG/ML      Clinical Data: No additional findings.   Subjective: Chief Complaint  Patient presents with  . Right Knee - Pain    HPI Krystal Rogers is a pleasant 57 year old female new patient who presents to our clinic today with right knee pain.  This began approximately 2 months ago without any known injury or change in activity.  All of her pain is medial aspect.  No locking, catching or instability.  It is worse with knee extension as well as driving.  She was seen in urgent care center on 03/28/2017 she was given a knee sleeve and NSAIDs.  These have helped her knee slightly.  She works on her feet daily.  No previous cortisone injection or history of gout.  Of note she does have a history surgery from a tibia fracture as a kid.  Review of Systems as detailed in HPI.   All others reviewed and are negative.   Objective: Vital Signs: There were no vitals taken for this visit.  Physical Exam well-developed well-nourished female no acute distress.  Alert and oriented x3.  Ortho Exam examination of her right knee reveals no effusion.  Range of motion 0-110 degrees.  Marked tenderness medial joint line over the medial meniscus.  Positive medial McMurray.  Minimal patellofemoral crepitus.  No tenderness over the pes bursa.  She is stable valgus and varus stress.  She is nervous intact distally.  Specialty Comments:  No specialty comments available.  Imaging: Xr Knee 1-2 Views Right  Result Date: 04/24/2017 2 views of the right knee reveal minimal tricompartmental degenerative changes    PMFS History: There are no active problems to display for this patient.  No past medical history on file.  Family History  Problem Relation Age of Onset  . Cancer Father   . Cancer Brother     Past Surgical History:  Procedure Laterality Date  . ABDOMINAL HYSTERECTOMY     Social History   Occupational History  . Not on file  Tobacco Use  . Smoking status: Current Every Day Smoker    Packs/day: 0.50    Years: 20.00  Pack years: 10.00    Types: Cigarettes  . Smokeless tobacco: Never Used  Substance and Sexual Activity  . Alcohol use: No  . Drug use: No  . Sexual activity: Yes

## 2017-04-24 NOTE — Telephone Encounter (Signed)
CMA attempt to call patient regarding about lab result.   Patient allow to leave a detail message on [phone].  Left a callback number for patient to call back.

## 2017-04-24 NOTE — Telephone Encounter (Signed)
-----   Message from Gildardo Pounds, NP sent at 04/23/2017  9:00 AM EST ----- LDL which is a form of cholesterol is slightly elevated. Work on a low fat, heart healthy diet and participate in regular aerobic exercise program to control as well by working out at least 150 minutes per week. No fried foods. No junk foods, sodas, sugary drinks, unhealthy snacking, or smoking. Also your TSH which is your thyroid hormone indicator is slightly low normal; I am ordering additional blood work for evaluation. Vitamin D is below normal. Please make sure you take at least 2000units of vitamin D daily over the counter.

## 2017-04-28 ENCOUNTER — Telehealth: Payer: Self-pay

## 2017-04-28 NOTE — Telephone Encounter (Signed)
-----   Message from Gildardo Pounds, NP sent at 04/28/2017  1:00 AM EST ----- Thyroid hormone level is normal

## 2017-04-28 NOTE — Telephone Encounter (Signed)
CMA called patient regarding lab result.  Patient understood and verified DOB.

## 2017-04-28 NOTE — Progress Notes (Signed)
Patient asked if she need to make a lab appointment for the additional blood work?

## 2017-04-30 ENCOUNTER — Telehealth: Payer: Self-pay

## 2017-04-30 NOTE — Telephone Encounter (Signed)
CMA called patient to informed her she do not need to schedule for an additional blood work.   Patient understood.

## 2017-04-30 NOTE — Telephone Encounter (Signed)
-----   Message from Gildardo Pounds, NP sent at 04/28/2017 11:15 PM EST ----- NO her additional tests were normal. Thank you ----- Message ----- From: Mariane Baumgarten, CMA Sent: 04/28/2017   9:16 AM To: Gildardo Pounds, NP  Patient asked if she need to make a lab appointment for the additional blood work?

## 2017-05-06 ENCOUNTER — Encounter: Payer: Self-pay | Admitting: Nurse Practitioner

## 2017-05-06 ENCOUNTER — Ambulatory Visit: Payer: PRIVATE HEALTH INSURANCE | Attending: Nurse Practitioner | Admitting: Nurse Practitioner

## 2017-05-06 VITALS — BP 99/62 | HR 78 | Temp 98.4°F | Ht 63.0 in | Wt 119.4 lb

## 2017-05-06 DIAGNOSIS — Z01419 Encounter for gynecological examination (general) (routine) without abnormal findings: Secondary | ICD-10-CM | POA: Diagnosis not present

## 2017-05-06 DIAGNOSIS — Z716 Tobacco abuse counseling: Secondary | ICD-10-CM | POA: Diagnosis not present

## 2017-05-06 DIAGNOSIS — Z79899 Other long term (current) drug therapy: Secondary | ICD-10-CM | POA: Insufficient documentation

## 2017-05-06 DIAGNOSIS — F17210 Nicotine dependence, cigarettes, uncomplicated: Secondary | ICD-10-CM | POA: Diagnosis not present

## 2017-05-06 DIAGNOSIS — F17200 Nicotine dependence, unspecified, uncomplicated: Secondary | ICD-10-CM | POA: Insufficient documentation

## 2017-05-06 NOTE — Patient Instructions (Addendum)
Health Maintenance for Postmenopausal Women Menopause is a normal process in which your reproductive ability comes to an end. This process happens gradually over a span of months to years, usually between the ages of 22 and 9. Menopause is complete when you have missed 12 consecutive menstrual periods. It is important to talk with your health care provider about some of the most common conditions that affect postmenopausal women, such as heart disease, cancer, and bone loss (osteoporosis). Adopting a healthy lifestyle and getting preventive care can help to promote your health and wellness. Those actions can also lower your chances of developing some of these common conditions. What should I know about menopause? During menopause, you may experience a number of symptoms, such as:  Moderate-to-severe hot flashes.  Night sweats.  Decrease in sex drive.  Mood swings.  Headaches.  Tiredness.  Irritability.  Memory problems.  Insomnia.  Choosing to treat or not to treat menopausal changes is an individual decision that you make with your health care provider. What should I know about hormone replacement therapy and supplements? Hormone therapy products are effective for treating symptoms that are associated with menopause, such as hot flashes and night sweats. Hormone replacement carries certain risks, especially as you become older. If you are thinking about using estrogen or estrogen with progestin treatments, discuss the benefits and risks with your health care provider. What should I know about heart disease and stroke? Heart disease, heart attack, and stroke become more likely as you age. This may be due, in part, to the hormonal changes that your body experiences during menopause. These can affect how your body processes dietary fats, triglycerides, and cholesterol. Heart attack and stroke are both medical emergencies. There are many things that you can do to help prevent heart disease  and stroke:  Have your blood pressure checked at least every 1-2 years. High blood pressure causes heart disease and increases the risk of stroke.  If you are 53-22 years old, ask your health care provider if you should take aspirin to prevent a heart attack or a stroke.  Do not use any tobacco products, including cigarettes, chewing tobacco, or electronic cigarettes. If you need help quitting, ask your health care provider.  It is important to eat a healthy diet and maintain a healthy weight. ? Be sure to include plenty of vegetables, fruits, low-fat dairy products, and lean protein. ? Avoid eating foods that are high in solid fats, added sugars, or salt (sodium).  Get regular exercise. This is one of the most important things that you can do for your health. ? Try to exercise for at least 150 minutes each week. The type of exercise that you do should increase your heart rate and make you sweat. This is known as moderate-intensity exercise. ? Try to do strengthening exercises at least twice each week. Do these in addition to the moderate-intensity exercise.  Know your numbers.Ask your health care provider to check your cholesterol and your blood glucose. Continue to have your blood tested as directed by your health care provider.  What should I know about cancer screening? There are several types of cancer. Take the following steps to reduce your risk and to catch any cancer development as early as possible. Breast Cancer  Practice breast self-awareness. ? This means understanding how your breasts normally appear and feel. ? It also means doing regular breast self-exams. Let your health care provider know about any changes, no matter how small.  If you are 40  or older, have a clinician do a breast exam (clinical breast exam or CBE) every year. Depending on your age, family history, and medical history, it may be recommended that you also have a yearly breast X-ray (mammogram).  If you  have a family history of breast cancer, talk with your health care provider about genetic screening.  If you are at high risk for breast cancer, talk with your health care provider about having an MRI and a mammogram every year.  Breast cancer (BRCA) gene test is recommended for women who have family members with BRCA-related cancers. Results of the assessment will determine the need for genetic counseling and BRCA1 and for BRCA2 testing. BRCA-related cancers include these types: ? Breast. This occurs in males or females. ? Ovarian. ? Tubal. This may also be called fallopian tube cancer. ? Cancer of the abdominal or pelvic lining (peritoneal cancer). ? Prostate. ? Pancreatic.  Cervical, Uterine, and Ovarian Cancer Your health care provider may recommend that you be screened regularly for cancer of the pelvic organs. These include your ovaries, uterus, and vagina. This screening involves a pelvic exam, which includes checking for microscopic changes to the surface of your cervix (Pap test).  For women ages 21-65, health care providers may recommend a pelvic exam and a Pap test every three years. For women ages 79-65, they may recommend the Pap test and pelvic exam, combined with testing for human papilloma virus (HPV), every five years. Some types of HPV increase your risk of cervical cancer. Testing for HPV may also be done on women of any age who have unclear Pap test results.  Other health care providers may not recommend any screening for nonpregnant women who are considered low risk for pelvic cancer and have no symptoms. Ask your health care provider if a screening pelvic exam is right for you.  If you have had past treatment for cervical cancer or a condition that could lead to cancer, you need Pap tests and screening for cancer for at least 20 years after your treatment. If Pap tests have been discontinued for you, your risk factors (such as having a new sexual partner) need to be  reassessed to determine if you should start having screenings again. Some women have medical problems that increase the chance of getting cervical cancer. In these cases, your health care provider may recommend that you have screening and Pap tests more often.  If you have a family history of uterine cancer or ovarian cancer, talk with your health care provider about genetic screening.  If you have vaginal bleeding after reaching menopause, tell your health care provider.  There are currently no reliable tests available to screen for ovarian cancer.  Lung Cancer Lung cancer screening is recommended for adults 69-62 years old who are at high risk for lung cancer because of a history of smoking. A yearly low-dose CT scan of the lungs is recommended if you:  Currently smoke.  Have a history of at least 30 pack-years of smoking and you currently smoke or have quit within the past 15 years. A pack-year is smoking an average of one pack of cigarettes per day for one year.  Yearly screening should:  Continue until it has been 15 years since you quit.  Stop if you develop a health problem that would prevent you from having lung cancer treatment.  Colorectal Cancer  This type of cancer can be detected and can often be prevented.  Routine colorectal cancer screening usually begins at  age 42 and continues through age 45.  If you have risk factors for colon cancer, your health care provider may recommend that you be screened at an earlier age.  If you have a family history of colorectal cancer, talk with your health care provider about genetic screening.  Your health care provider may also recommend using home test kits to check for hidden blood in your stool.  A small camera at the end of a tube can be used to examine your colon directly (sigmoidoscopy or colonoscopy). This is done to check for the earliest forms of colorectal cancer.  Direct examination of the colon should be repeated every  5-10 years until age 71. However, if early forms of precancerous polyps or small growths are found or if you have a family history or genetic risk for colorectal cancer, you may need to be screened more often.  Skin Cancer  Check your skin from head to toe regularly.  Monitor any moles. Be sure to tell your health care provider: ? About any new moles or changes in moles, especially if there is a change in a mole's shape or color. ? If you have a mole that is larger than the size of a pencil eraser.  If any of your family members has a history of skin cancer, especially at a young age, talk with your health care provider about genetic screening.  Always use sunscreen. Apply sunscreen liberally and repeatedly throughout the day.  Whenever you are outside, protect yourself by wearing long sleeves, pants, a wide-brimmed hat, and sunglasses.  What should I know about osteoporosis? Osteoporosis is a condition in which bone destruction happens more quickly than new bone creation. After menopause, you may be at an increased risk for osteoporosis. To help prevent osteoporosis or the bone fractures that can happen because of osteoporosis, the following is recommended:  If you are 46-71 years old, get at least 1,000 mg of calcium and at least 600 mg of vitamin D per day.  If you are older than age 55 but younger than age 65, get at least 1,200 mg of calcium and at least 600 mg of vitamin D per day.  If you are older than age 54, get at least 1,200 mg of calcium and at least 800 mg of vitamin D per day.  Smoking and excessive alcohol intake increase the risk of osteoporosis. Eat foods that are rich in calcium and vitamin D, and do weight-bearing exercises several times each week as directed by your health care provider. What should I know about how menopause affects my mental health? Depression may occur at any age, but it is more common as you become older. Common symptoms of depression  include:  Low or sad mood.  Changes in sleep patterns.  Changes in appetite or eating patterns.  Feeling an overall lack of motivation or enjoyment of activities that you previously enjoyed.  Frequent crying spells.  Talk with your health care provider if you think that you are experiencing depression. What should I know about immunizations? It is important that you get and maintain your immunizations. These include:  Tetanus, diphtheria, and pertussis (Tdap) booster vaccine.  Influenza every year before the flu season begins.  Pneumonia vaccine.  Shingles vaccine.  Your health care provider may also recommend other immunizations. This information is not intended to replace advice given to you by your health care provider. Make sure you discuss any questions you have with your health care provider. Document Released: 05/17/2005  Document Revised: 10/13/2015 Document Reviewed: 12/27/2014 Elsevier Interactive Patient Education  2018 [employer].  Vitamin D Deficiency Vitamin D deficiency is when your body does not have enough vitamin D. Vitamin D is important because:  It helps your body use other minerals that your body needs.  It helps keep your bones strong and healthy.  It may help to prevent some diseases.  It helps your heart and other muscles work well.  You can get vitamin D by:  Eating foods with vitamin D in them.  Drinking or eating milk or other foods that have had vitamin D added to them.  Taking a vitamin D supplement.  Being in the sun.  Not getting enough vitamin D can make your bones become soft. It can also cause other health problems. Follow these instructions at home:  Take medicines and supplements only as told by your doctor.  Eat foods that have vitamin D. These include: ? Dairy products, cereals, or juices with added vitamin D. Check the label for vitamin D. ? Fatty fish like salmon or trout. ? Eggs. ? Oysters.  Do not use tanning  beds.  Stay at a healthy weight. Lose weight, if needed.  Keep all follow-up visits as told by your doctor. This is important. Contact a doctor if:  Your symptoms do not go away.  You feel sick to your stomach (nauseous).  Youthrow up (vomit).  You poop less often than usual or you have trouble pooping (constipation). This information is not intended to replace advice given to you by your health care provider. Make sure you discuss any questions you have with your health care provider. Document Released: 03/14/2011 Document Revised: 08/31/2015 Document Reviewed: 08/10/2014 Elsevier Interactive Patient Education  2018 [employer].  Vitamin D Deficiency Vitamin D deficiency is when your body does not have enough vitamin D. Vitamin D is important to your body for many reasons:  It helps the body to absorb two important minerals, called calcium and phosphorus.  It plays a role in bone health.  It may help to prevent some diseases, such as diabetes and multiple sclerosis.  It plays a role in muscle function, including heart function.  You can get vitamin D by:  Eating foods that naturally contain vitamin D.  Eating or drinking milk or other dairy products that have vitamin D added to them.  Taking a vitamin D supplement or a multivitamin supplement that contains vitamin D.  Being in the sun. Your body naturally makes vitamin D when your skin is exposed to sunlight. Your body changes the sunlight into a form of the vitamin that the body can use.  If vitamin D deficiency is severe, it can cause a condition in which your bones become soft. In adults, this condition is called osteomalacia. In children, this condition is called rickets. What are the causes? Vitamin D deficiency may be caused by:  Not eating enough foods that contain vitamin D.  Not getting enough sun exposure.  Having certain digestive system diseases that make it difficult for your body to absorb vitamin D.  These diseases include Crohn disease, chronic pancreatitis, and cystic fibrosis.  Having a surgery in which a part of the stomach or a part of the small intestine is removed.  Being obese.  Having chronic kidney disease or liver disease.  What increases the risk? This condition is more likely to develop in:  Older people.  People who do not spend much time outdoors.  People who live in  a long-term care facility.  People who have had broken bones.  People with weak or thin bones (osteoporosis).  People who have a disease or condition that changes how the body absorbs vitamin D.  People who have dark skin.  People who take certain medicines, such as steroid medicines or certain seizure medicines.  People who are overweight or obese.  What are the signs or symptoms? In mild cases of vitamin D deficiency, there may not be any symptoms. If the condition is severe, symptoms may include:  Bone pain.  Muscle pain.  Falling often.  Broken bones caused by a minor injury.  How is this diagnosed? This condition is usually diagnosed with a blood test. How is this treated? Treatment for this condition may depend on what caused the condition. Treatment options include:  Taking vitamin D supplements.  Taking a calcium supplement. Your health care provider will suggest what dose is best for you.  Follow these instructions at home:  Take medicines and supplements only as told by your health care provider.  Eat foods that contain vitamin D. Choices include: ? Fortified dairy products, cereals, or juices. Fortified means that vitamin D has been added to the food. Check the label on the package to be sure. ? Fatty fish, such as salmon or trout. ? Eggs. ? Oysters.  Do not use a tanning bed.  Maintain a healthy weight. Lose weight, if needed.  Keep all follow-up visits as told by your health care provider. This is important. Contact a health care provider if:  Your symptoms  do not go away.  You feel like throwing up (nausea) or you throw up (vomit).  You have fewer bowel movements than usual or it is difficult for you to have a bowel movement (constipation). This information is not intended to replace advice given to you by your health care provider. Make sure you discuss any questions you have with your health care provider. Document Released: 06/17/2011 Document Revised: 09/06/2015 Document Reviewed: 08/10/2014 Elsevier Interactive Patient Education  2018 [employer].

## 2017-05-06 NOTE — Progress Notes (Signed)
Assessment & Plan:  [name] was seen today for annual exam.  Diagnoses and all orders for this visit:  Well woman exam with routine gynecological exam -     Cytology - PAP  Tobacco dependence [name] was counseled on the dangers of tobacco use, and was advised to quit. Reviewed strategies to maximize success, including removing cigarettes and smoking materials from environment, stress management and support of family/friends as well as pharmacological alternatives including: Wellbutrin, Chantix, Nicotine patch, Nicotine gum or lozenges. Smoking cessation support: smoking cessation hotline: 1-800-QUIT-NOW.  Smoking cessation classes are also available through [facility]. Call ([phone] or visit our website at [email or web address]   Spent 5 minutes counseling on smoking cessation and patient is ready to quit. She is down to 6 cigarettes per day. Declines smoking cessation medications today.  Patient has been counseled on age-appropriate routine health concerns for screening and prevention. These are reviewed and up-to-date. Referrals have been placed accordingly. Immunizations are up-to-date or declined.    Subjective:   Chief Complaint  Patient presents with  . Annual Exam    Patient is here for a physical and a pap.    HPI Krystal Rogers 57 y.o. female presents to office today for well woman exam with PAP.    Review of Systems  Constitutional: Negative.  Negative for chills, fever, malaise/fatigue and weight loss.  HENT: Negative.  Negative for congestion, hearing loss, sinus pain and sore throat.   Eyes: Negative.  Negative for blurred vision, double vision, photophobia and pain.  Respiratory: Negative.  Negative for cough, sputum production, shortness of breath and wheezing.   Cardiovascular: Negative.  Negative for chest pain and leg swelling.  Gastrointestinal: Negative.  Negative for abdominal pain, constipation, diarrhea, heartburn, nausea and  vomiting.  Genitourinary: Negative.   Musculoskeletal: Negative.  Negative for joint pain and myalgias.  Skin: Negative.  Negative for rash.  Neurological: Negative.  Negative for dizziness, tremors, speech change, focal weakness, seizures and headaches.  Endo/Heme/Allergies: Negative.  Negative for environmental allergies.  Psychiatric/Behavioral: Negative.  Negative for depression and suicidal ideas. The patient is not nervous/anxious and does not have insomnia.     History reviewed. No pertinent past medical history.  Past Surgical History:  Procedure Laterality Date  . ABDOMINAL HYSTERECTOMY      Family History  Problem Relation Age of Onset  . Cancer Father   . Cancer Brother     Social History Reviewed with no changes to be made today.   Outpatient Medications Prior to Visit  Medication Sig Dispense Refill  . cetirizine (ZYRTEC ALLERGY) 10 MG tablet Take 1 tablet (10 mg total) by mouth daily. 30 tablet 1  . diclofenac sodium (VOLTAREN) 1 % GEL Apply 2 g topically 4 (four) times daily. 100 g 0  . fluticasone (FLONASE) 50 MCG/ACT nasal spray Place 2 sprays into both nostrils daily. 16 g 0  . erythromycin ophthalmic ointment Place a 1/2 inch ribbon of ointment into the lower eyelid. (Patient not taking: Reported on 04/22/2017) 3.5 g 1   No facility-administered medications prior to visit.     Allergies  Allergen Reactions  . Madelaine Bhat Isothiocyanate] Hives       Objective:    BP 99/62 (BP Location: Left Arm, Patient Position: Sitting, Cuff Size: Normal)   Pulse 78   Temp 98.4 F (36.9 C) (Oral)   Ht 5\' 3"  (1.6 m)   Wt 119 lb 6.4 oz (54.2 kg)   SpO2 99%  BMI 21.15 kg/m  Wt Readings from Last 3 Encounters:  05/06/17 119 lb 6.4 oz (54.2 kg)  04/22/17 119 lb 12.8 oz (54.3 kg)  03/01/16 123 lb 6.4 oz (56 kg)    Physical Exam  Constitutional: She is oriented to person, place, and time. She appears well-developed and well-nourished. No distress.  HENT:    Head: Normocephalic and atraumatic.  Right Ear: Hearing, tympanic membrane and external ear normal.  Left Ear: Hearing, tympanic membrane, external ear and ear canal normal.  Nose: Mucosal edema present. No rhinorrhea. Right sinus exhibits no maxillary sinus tenderness and no frontal sinus tenderness. Left sinus exhibits no maxillary sinus tenderness and no frontal sinus tenderness.  Mouth/Throat: Uvula is midline, oropharynx is clear and moist and mucous membranes are normal. No oropharyngeal exudate.  Edentulous   Eyes: Conjunctivae and EOM are normal. Pupils are equal, round, and reactive to light. Right eye exhibits no discharge. Left eye exhibits no discharge. No scleral icterus.  Neck: Normal range of motion. Neck supple. No tracheal deviation present. No thyromegaly present.  Cardiovascular: Normal rate, regular rhythm, normal heart sounds and intact distal pulses. Exam reveals no gallop and no friction rub.  Pulmonary/Chest: Effort normal and breath sounds normal. No respiratory distress. She has no decreased breath sounds. She has no wheezes. She has no rhonchi. She has no rales. She exhibits no mass and no tenderness. Right breast exhibits no inverted nipple, no mass, no nipple discharge, no skin change and no tenderness. Left breast exhibits no inverted nipple, no mass, no nipple discharge, no skin change and no tenderness.  Abdominal: Soft. Bowel sounds are normal. She exhibits no distension and no mass. There is no tenderness. There is no rebound and no guarding. Hernia confirmed negative in the right inguinal area and confirmed negative in the left inguinal area.  Genitourinary: Vagina normal. Rectal exam shows no external hemorrhoid, no fissure, no mass and anal tone normal. No breast swelling, tenderness, discharge or bleeding. Pelvic exam was performed with patient supine. No labial fusion. There is no rash, tenderness, lesion or injury on the right labia. There is no rash, tenderness,  lesion or injury on the left labia. Uterus is not tender. Cervix exhibits no motion tenderness, no discharge and no friability. Right adnexum displays no mass, no tenderness and no fullness. Left adnexum displays no mass, no tenderness and no fullness. No erythema, tenderness or bleeding in the vagina. No vaginal discharge found.  Musculoskeletal: Normal range of motion. She exhibits no edema or tenderness.       Right hand: She exhibits deformity (right pinky missing distal phalange from previous work accident).       Hands: RLE 4/5 due to right knee pain LLE 5/5   Lymphadenopathy:    She has no cervical adenopathy.  Neurological: She is alert and oriented to person, place, and time. She displays no atrophy and no tremor. No cranial nerve deficit or sensory deficit. She exhibits normal muscle tone. She displays a negative Romberg sign. She displays no seizure activity. Coordination and gait normal.  Skin: Skin is warm and dry. No rash noted. She is not diaphoretic. No erythema. No pallor.  Psychiatric: She has a normal mood and affect. Her behavior is normal. Judgment and thought content normal.       Patient has been counseled extensively about nutrition and exercise as well as the importance of adherence with medications and regular follow-up. The patient was given clear instructions to go to ER or return  to medical center if symptoms don't improve, worsen or new problems develop. The patient verbalized understanding.   Follow-up: Return in about 6 months (around 11/03/2017).   Gildardo Pounds, FNP-BC [facility] [place], [place]   05/06/2017, 10:48 AM

## 2017-05-08 LAB — CYTOLOGY - PAP
DIAGNOSIS: NEGATIVE
HPV (WINDOPATH): NOT DETECTED

## 2017-05-09 ENCOUNTER — Telehealth: Payer: Self-pay

## 2017-05-09 NOTE — Telephone Encounter (Signed)
CMA attempt to call patient regarding lab result.  Patient did not answer and CMA left a detail message/callback number.  If patient call please let her know:  !!Your PAP smear is normal. Next PAP in 3 years!!

## 2017-05-09 NOTE — Telephone Encounter (Signed)
-----   Message from Gildardo Pounds, NP sent at 05/08/2017  8:50 PM EST ----- Your PAP smear is normal. Next PAP in 3 years

## 2017-05-20 ENCOUNTER — Other Ambulatory Visit: Payer: Self-pay | Admitting: Nurse Practitioner

## 2017-05-20 DIAGNOSIS — Z1231 Encounter for screening mammogram for malignant neoplasm of breast: Secondary | ICD-10-CM

## 2017-06-06 ENCOUNTER — Ambulatory Visit
Admission: RE | Admit: 2017-06-06 | Discharge: 2017-06-06 | Disposition: A | Discharge status: Home / Self Care | Payer: PRIVATE HEALTH INSURANCE | Source: Ambulatory Visit | Attending: Nurse Practitioner | Admitting: Nurse Practitioner

## 2017-06-06 DIAGNOSIS — Z1231 Encounter for screening mammogram for malignant neoplasm of breast: Secondary | ICD-10-CM

## 2017-11-03 ENCOUNTER — Encounter: Payer: Self-pay | Admitting: Nurse Practitioner

## 2017-11-03 ENCOUNTER — Ambulatory Visit: Payer: PRIVATE HEALTH INSURANCE | Attending: Nurse Practitioner | Admitting: Nurse Practitioner

## 2017-11-03 VITALS — BP 115/79 | HR 78 | Temp 98.7°F | Ht 63.0 in | Wt 114.0 lb

## 2017-11-03 DIAGNOSIS — E559 Vitamin D deficiency, unspecified: Secondary | ICD-10-CM | POA: Diagnosis not present

## 2017-11-03 DIAGNOSIS — M25561 Pain in right knee: Secondary | ICD-10-CM | POA: Diagnosis not present

## 2017-11-03 MED ORDER — DICLOFENAC SODIUM 1 % TD GEL: 2.0000 g | Freq: Four times a day (QID) | TRANSDERMAL | 0 refills | Status: DC

## 2017-11-03 NOTE — Patient Instructions (Signed)
Knee Pain, Adult Many things can cause knee pain. The pain often goes away on its own with time and rest. If the pain does not go away, tests may be done to find out what is causing the pain. Follow these instructions at home: Activity  Rest your knee.  Do not do things that cause pain.  Avoid activities where both feet leave the ground at the same time (high-impact activities). Examples are running, jumping rope, and doing jumping jacks. General instructions  Take medicines only as told by your doctor.  Raise (elevate) your knee when you are resting. Make sure your knee is higher than your heart.  Sleep with a pillow under your knee.  If told, put ice on the knee: ? Put ice in a plastic bag. ? Place a towel between your skin and the bag. ? Leave the ice on for 20 minutes, 2-3 times a day.  Ask your doctor if you should wear an elastic knee support.  Lose weight if you are overweight. Being overweight can make your knee hurt more.  Do not use any tobacco products. These include cigarettes, chewing tobacco, or electronic cigarettes. If you need help quitting, ask your doctor. Smoking may slow down healing. Contact a doctor if:  The pain does not stop.  The pain changes or gets worse.  You have a fever along with knee pain.  Your knee gives out or locks up.  Your knee swells, and becomes worse. Get help right away if:  Your knee feels warm.  You cannot move your knee.  You have very bad knee pain.  You have chest pain.  You have trouble breathing. Summary  Many things can cause knee pain. The pain often goes away on its own with time and rest.  Avoid activities that put stress on your knee. These include running and jumping rope.  Get help right away if you cannot move your knee, or if your knee feels warm, or if you have trouble breathing. This information is not intended to replace advice given to you by your health care provider. Make sure you discuss any  questions you have with your health care provider. Document Released: 06/21/2008 Document Revised: 03/19/2016 Document Reviewed: 03/19/2016 Elsevier Interactive Patient Education  2017 [place]. High-Protein and High-Calorie Diet Eating high-protein and high-calorie foods can help you to gain weight, heal after an injury, and recover after an illness or surgery. What is my plan? The specific amount of daily protein and calories you need depends on:  Your body weight.  The reason this diet is recommended for you.  Generally, a high-protein, high-calorie diet involves:  Eating 250-500 extra calories each day.  Making sure that 10-35% of your daily calories come from protein.  Talk to your health care provider about how much protein and how many calories you need each day. Follow the diet as directed by your health care provider. What do I need to know about this diet?  Ask your health care provider if you should take a nutritional supplement.  Try to eat six small meals each day instead of three large meals.  Eat a balanced diet, including one food that is high in protein at each meal.  Keep nutritious snacks handy, such as nuts, trail mixes, dried fruit, and yogurt.  If you have kidney disease or diabetes, eating too much protein may put extra stress on your kidneys. Talk to your health care provider if you have either of those conditions. What are  some high-protein foods? Grains Quinoa. Bulgur wheat. Vegetables Soybeans. Peas. Meats and Other Protein Sources Beef, pork, and poultry. Fish and seafood. Eggs. Tofu. Textured vegetable protein (TVP). Peanut butter. Nuts and seeds. Dried beans. Protein powders. Dairy Whole milk. Whole-milk yogurt. Powdered milk. Cheese. [employer]. Eggnog. Beverages High-protein supplement drinks. Soy milk. Other Protein bars. The items listed above may not be a complete list of recommended foods or beverages. Contact your dietitian for  more options. What are some high-calorie foods? Grains Pasta. Quick breads. Muffins. Pancakes. Ready-to-eat cereal. Vegetables Vegetables cooked in oil or butter. Fried potatoes. Fruits Dried fruit. Fruit leather. Canned fruit in syrup. Fruit juice. Avocados. Meats and Other Protein Sources Peanut butter. Nuts and seeds. Dairy Heavy cream. Whipped cream. Cream cheese. Sour cream. Ice cream. Custard. Pudding. Beverages Meal-replacement beverages. Nutrition shakes. Fruit juice. Sugar-sweetened soft drinks. Condiments Salad dressing. Mayonnaise. Alfredo sauce. Fruit preserves or jelly. Honey. Syrup. Sweets/Desserts Cake. Cookies. Pie. Pastries. Candy bars. Chocolate. Fats and Oils Butter or margarine. Oil. Gravy. Other Meal-replacement bars. The items listed above may not be a complete list of recommended foods or beverages. Contact your dietitian for more options. What are some tips for including high-protein and high-calorie foods in my diet?  Add whole milk, half-and-half, or heavy cream to cereal, pudding, soup, or hot cocoa.  Add whole milk to instant breakfast drinks.  Add peanut butter to oatmeal or smoothies.  Add powdered milk to baked goods, smoothies, or milkshakes.  Add powdered milk, cream, or butter to mashed potatoes.  Add cheese to cooked vegetables.  Make whole-milk yogurt parfaits. Top them with granola, fruit, or nuts.  Add cottage cheese to your fruit.  Add avocados, cheese, or both to sandwiches or salads.  Add meat, poultry, or seafood to rice, pasta, casseroles, salads, and soups.  Use mayonnaise when making egg salad, chicken salad, or tuna salad.  Use peanut butter as a topping for pretzels, celery, or crackers.  Add beans to casseroles, dips, and spreads.  Add pureed beans to sauces and soups.  Replace calorie-free drinks with calorie-containing drinks, such as milk and fruit juice. This information is not intended to replace advice given  to you by your health care provider. Make sure you discuss any questions you have with your health care provider. Document Released: 03/25/2005 Document Revised: 08/31/2015 Document Reviewed: 09/07/2013 Elsevier Interactive Patient Education  2018 [place]. High-Protein and High-Calorie Diet Eating high-protein and high-calorie foods can help you to gain weight, heal after an injury, and recover after an illness or surgery. What is my plan? The specific amount of daily protein and calories you need depends on:  Your body weight.  The reason this diet is recommended for you.  Generally, a high-protein, high-calorie diet involves:  Eating 250-500 extra calories each day.  Making sure that 10-35% of your daily calories come from protein.  Talk to your health care provider about how much protein and how many calories you need each day. Follow the diet as directed by your health care provider. What do I need to know about this diet?  Ask your health care provider if you should take a nutritional supplement.  Try to eat six small meals each day instead of three large meals.  Eat a balanced diet, including one food that is high in protein at each meal.  Keep nutritious snacks handy, such as nuts, trail mixes, dried fruit, and yogurt.  If you have kidney disease or diabetes, eating too much protein may put  extra stress on your kidneys. Talk to your health care provider if you have either of those conditions. What are some high-protein foods? Grains Quinoa. Bulgur wheat. Vegetables Soybeans. Peas. Meats and Other Protein Sources Beef, pork, and poultry. Fish and seafood. Eggs. Tofu. Textured vegetable protein (TVP). Peanut butter. Nuts and seeds. Dried beans. Protein powders. Dairy Whole milk. Whole-milk yogurt. Powdered milk. Cheese. [employer]. Eggnog. Beverages High-protein supplement drinks. Soy milk. Other Protein bars. The items listed above may not be a complete list  of recommended foods or beverages. Contact your dietitian for more options. What are some high-calorie foods? Grains Pasta. Quick breads. Muffins. Pancakes. Ready-to-eat cereal. Vegetables Vegetables cooked in oil or butter. Fried potatoes. Fruits Dried fruit. Fruit leather. Canned fruit in syrup. Fruit juice. Avocados. Meats and Other Protein Sources Peanut butter. Nuts and seeds. Dairy Heavy cream. Whipped cream. Cream cheese. Sour cream. Ice cream. Custard. Pudding. Beverages Meal-replacement beverages. Nutrition shakes. Fruit juice. Sugar-sweetened soft drinks. Condiments Salad dressing. Mayonnaise. Alfredo sauce. Fruit preserves or jelly. Honey. Syrup. Sweets/Desserts Cake. Cookies. Pie. Pastries. Candy bars. Chocolate. Fats and Oils Butter or margarine. Oil. Gravy. Other Meal-replacement bars. The items listed above may not be a complete list of recommended foods or beverages. Contact your dietitian for more options. What are some tips for including high-protein and high-calorie foods in my diet?  Add whole milk, half-and-half, or heavy cream to cereal, pudding, soup, or hot cocoa.  Add whole milk to instant breakfast drinks.  Add peanut butter to oatmeal or smoothies.  Add powdered milk to baked goods, smoothies, or milkshakes.  Add powdered milk, cream, or butter to mashed potatoes.  Add cheese to cooked vegetables.  Make whole-milk yogurt parfaits. Top them with granola, fruit, or nuts.  Add cottage cheese to your fruit.  Add avocados, cheese, or both to sandwiches or salads.  Add meat, poultry, or seafood to rice, pasta, casseroles, salads, and soups.  Use mayonnaise when making egg salad, chicken salad, or tuna salad.  Use peanut butter as a topping for pretzels, celery, or crackers.  Add beans to casseroles, dips, and spreads.  Add pureed beans to sauces and soups.  Replace calorie-free drinks with calorie-containing drinks, such as milk and fruit  juice. This information is not intended to replace advice given to you by your health care provider. Make sure you discuss any questions you have with your health care provider. Document Released: 03/25/2005 Document Revised: 08/31/2015 Document Reviewed: 09/07/2013 Elsevier Interactive Patient Education  [employer].

## 2017-11-03 NOTE — Progress Notes (Signed)
Assessment & Plan:  Cathrine was seen today for follow-up.  Diagnoses and all orders for this visit:  Acute pain of right knee -     diclofenac sodium (VOLTAREN) 1 % GEL; Apply 2 g topically 4 (four) times daily. May alternate with heat and ice application for pain relief. May also alternate with acetaminophen as prescribed for pain. Other alternatives include acupuncture and water aerobics.  You must stay active and avoid a sedentary lifestyle.  Vitamin D insufficiency -     VITAMIN D 25 Hydroxy (Vit-D Deficiency, Fractures)    Patient has been counseled on age-appropriate routine health concerns for screening and prevention. These are reviewed and up-to-date. Referrals have been placed accordingly. Immunizations are up-to-date or declined.    Subjective:   Chief Complaint  Patient presents with  . Follow-up    Patient is here for a follow-up.   HPI Krystal Rogers 57 y.o. female presents to office today for follow up to right knee pain due to right knee degenerative medical meniscus tear. She had a cortisone injection to her knee 04-24-2017. She reports the injection only provided complete pain relief for 3 days. She was told she may need surgery and states today she didn't follow up because "nobody is going to cut on me". She is wearing a sleeve brace on her right knee today. Endorsing only mild pain and is requesting a refill of voltaren gel.    Review of Systems  Constitutional: Negative for fever, malaise/fatigue and weight loss.  HENT: Negative.  Negative for nosebleeds.   Eyes: Negative.  Negative for blurred vision, double vision and photophobia.  Respiratory: Negative.  Negative for cough and shortness of breath.   Cardiovascular: Negative.  Negative for chest pain, palpitations and leg swelling.  Gastrointestinal: Negative.  Negative for heartburn, nausea and vomiting.  Musculoskeletal: Positive for joint pain. Negative for myalgias.       SEE HPI  Neurological:  Negative.  Negative for dizziness, focal weakness, seizures and headaches.  Psychiatric/Behavioral: Negative.  Negative for suicidal ideas.    History reviewed. No pertinent past medical history.  Past Surgical History:  Procedure Laterality Date  . ABDOMINAL HYSTERECTOMY      Family History  Problem Relation Age of Onset  . Cancer Father   . Cancer Brother     Social History Reviewed with no changes to be made today.   Outpatient Medications Prior to Visit  Medication Sig Dispense Refill  . cetirizine (ZYRTEC ALLERGY) 10 MG tablet Take 1 tablet (10 mg total) by mouth daily. 30 tablet 1  . diclofenac sodium (VOLTAREN) 1 % GEL Apply 2 g topically 4 (four) times daily. 100 g 0  . erythromycin ophthalmic ointment Place a 1/2 inch ribbon of ointment into the lower eyelid. (Patient not taking: Reported on 04/22/2017) 3.5 g 1   No facility-administered medications prior to visit.     Allergies  Allergen Reactions  . Madelaine Bhat Isothiocyanate] Hives       Objective:    BP 115/79 (BP Location: Left Arm, Patient Position: Sitting, Cuff Size: Normal)   Pulse 78   Temp 98.7 F (37.1 C) (Oral)   Ht 5\' 3"  (1.6 m)   Wt 114 lb (51.7 kg)   SpO2 97%   BMI 20.19 kg/m  Wt Readings from Last 3 Encounters:  11/03/17 114 lb (51.7 kg)  05/06/17 119 lb 6.4 oz (54.2 kg)  04/22/17 119 lb 12.8 oz (54.3 kg)    Physical Exam  Constitutional: She is oriented to person, place, and time. She appears well-developed and well-nourished. She is cooperative.  HENT:  Head: Normocephalic and atraumatic.  Eyes: EOM are normal.  Neck: Normal range of motion.  Cardiovascular: Normal rate, regular rhythm, normal heart sounds and intact distal pulses. Exam reveals no gallop and no friction rub.  No murmur heard. Pulmonary/Chest: Effort normal and breath sounds normal. No tachypnea. No respiratory distress. She has no decreased breath sounds. She has no wheezes. She has no rhonchi. She has no rales.  She exhibits no tenderness.  Abdominal: Soft. Bowel sounds are normal.  Musculoskeletal: Normal range of motion. She exhibits no edema.       Right knee: She exhibits normal range of motion and no swelling. Tenderness found. Medial joint line and MCL tenderness noted. No patellar tendon tenderness noted.  Neurological: She is alert and oriented to person, place, and time. Coordination normal.  Skin: Skin is warm and dry.  Psychiatric: She has a normal mood and affect. Her behavior is normal. Judgment and thought content normal.  Nursing note and vitals reviewed.      Patient has been counseled extensively about nutrition and exercise as well as the importance of adherence with medications and regular follow-up. The patient was given clear instructions to go to ER or return to medical center if symptoms don't improve, worsen or new problems develop. The patient verbalized understanding.   Follow-up: Return if symptoms worsen or fail to improve.   Gildardo Pounds, FNP-BC [facility], [place]   11/03/2017, 3:16 PM

## 2017-11-04 ENCOUNTER — Telehealth: Payer: Self-pay

## 2017-11-04 LAB — VITAMIN D 25 HYDROXY (VIT D DEFICIENCY, FRACTURES): VIT D 25 HYDROXY: 24.7 ng/mL — AB (ref 30.0–100.0)

## 2017-11-04 NOTE — Telephone Encounter (Signed)
-----   Message from Gildardo Pounds, NP sent at 11/04/2017  9:48 AM EDT ----- Vitamin D is still slightly below normal. You can take [phone] units of vitamin D daily over the counter

## 2017-11-04 NOTE — Telephone Encounter (Signed)
CMA spoke to patient to inform on results.  Patient verified DOB. Patient understood.  

## 2018-07-10 ENCOUNTER — Other Ambulatory Visit: Payer: Self-pay | Admitting: Nurse Practitioner

## 2018-07-10 DIAGNOSIS — Z1231 Encounter for screening mammogram for malignant neoplasm of breast: Secondary | ICD-10-CM

## 2018-08-18 ENCOUNTER — Encounter: Payer: PRIVATE HEALTH INSURANCE | Admitting: Nurse Practitioner

## 2018-08-25 ENCOUNTER — Encounter: Payer: Self-pay | Admitting: Primary Care

## 2018-08-25 ENCOUNTER — Ambulatory Visit: Payer: PRIVATE HEALTH INSURANCE | Attending: Primary Care | Admitting: Primary Care

## 2018-08-25 ENCOUNTER — Other Ambulatory Visit: Payer: Self-pay

## 2018-08-25 DIAGNOSIS — F17210 Nicotine dependence, cigarettes, uncomplicated: Secondary | ICD-10-CM | POA: Diagnosis not present

## 2018-08-25 DIAGNOSIS — R05 Cough: Secondary | ICD-10-CM

## 2018-08-25 DIAGNOSIS — F17200 Nicotine dependence, unspecified, uncomplicated: Secondary | ICD-10-CM

## 2018-08-25 DIAGNOSIS — R059 Cough, unspecified: Secondary | ICD-10-CM

## 2018-08-25 DIAGNOSIS — J302 Other seasonal allergic rhinitis: Secondary | ICD-10-CM

## 2018-08-25 MED ORDER — FLUTICASONE PROPIONATE 50 MCG/ACT NA SUSP: 2.0000 | Freq: Every day | NASAL | 6 refills | Status: DC

## 2018-08-25 MED ORDER — CETIRIZINE HCL 10 MG PO TABS: 10.0000 mg | ORAL_TABLET | Freq: Every day | ORAL | 11 refills | Status: AC

## 2018-08-25 MED ORDER — BENZONATATE 100 MG PO CAPS: 100.0000 mg | ORAL_CAPSULE | Freq: Two times a day (BID) | ORAL | 0 refills | Status: DC | PRN

## 2018-08-25 MED ORDER — CETIRIZINE HCL 10 MG PO TABS: 10.0000 mg | ORAL_TABLET | Freq: Every day | ORAL | 11 refills | Status: DC

## 2018-08-25 NOTE — Progress Notes (Signed)
Virtual Visit via Telephone Note  I connected with Krystal Rogers on 08/25/18 at  3:55  PM EDT by telephone and verified that I am speaking with the correct person using two identifiers.   I discussed the limitations, risks, security and privacy concerns of performing an evaluation and management service by telephone and the availability of in person appointments. I also discussed with the patient that there may be a patient responsible charge related to this service. The patient expressed understanding and agreed to proceed.   History of Present Illness: Krystal Rogers has had a cough for 2-3 days she has use Nyquil with no relief. States cough so much her ribs hurt. Requesting cough medications. Denies shortness of breath, fever, chills and fatigue.   Observations/Objective: Review of Systems  Constitutional: Negative.   HENT: Negative.   Eyes: Negative.   Respiratory: Positive for sputum production.   Cardiovascular: Negative.   Gastrointestinal: Negative.   Genitourinary: Negative.   Musculoskeletal: Negative.   Skin: Negative.     Assessment and Plan: Krystal Rogers was seen today for cough.  Diagnoses and all orders for this visit:  Cough Maybe aggravated by/with smoking at the moment it is productive and interfering with her   Tobacco dependence Currently today only cough so much doesn't have a taste for a cigarette  Will encourage to continue cessation   Seasonal allergies -     Discontinue: cetirizine (ZYRTEC ALLERGY) 10 MG tablet; Take 1 tablet (10 mg total) by mouth daily. -     fluticasone (FLONASE) 50 MCG/ACT nasal spray; Place 2 sprays into both nostrils daily.  Other orders -     benzonatate (TESSALON) 100 MG capsule; Take 1 capsule (100 mg total) by mouth 2 (two) times daily as needed for cough. -     cetirizine (ZYRTEC) 10 MG tablet; Take 1 tablet (10 mg total) by mouth daily.    Follow Up Instructions:    I discussed the assessment and treatment plan with  the patient. The patient was provided an opportunity to ask questions and all were answered. The patient agreed with the plan and demonstrated an understanding of the instructions.   The patient was advised to call back or seek an in-person evaluation if the symptoms worsen or if the condition fails to improve as anticipated.  I provided 15 minutes of non-face-to-face time during this encounter.   Kerin Perna, NP

## 2018-08-25 NOTE — Progress Notes (Signed)
Patient verified DOB Patient has not taken any medication today. Patient took Nyquil last night. Patient has eaten today. Patient complains of rib pain from coughing consistently for 3 days. Cough is productive with a yellow tinge but mainly clear. Patient denies fever or body aches. Patient denies HA or diarrhea. No one else in the house has any symptoms. Patient states she has seasonal allergies and normally can clear it with benadryl but it provided no relief. No denies any exposure to COVID.

## 2018-09-04 ENCOUNTER — Ambulatory Visit: Payer: PRIVATE HEALTH INSURANCE

## 2018-09-23 ENCOUNTER — Encounter: Payer: PRIVATE HEALTH INSURANCE | Admitting: Nurse Practitioner

## 2018-11-23 ENCOUNTER — Encounter: Payer: PRIVATE HEALTH INSURANCE | Admitting: Nurse Practitioner

## 2018-12-25 ENCOUNTER — Encounter: Payer: PRIVATE HEALTH INSURANCE | Admitting: Nurse Practitioner

## 2019-01-22 ENCOUNTER — Encounter: Payer: PRIVATE HEALTH INSURANCE | Admitting: Nurse Practitioner

## 2019-03-02 ENCOUNTER — Other Ambulatory Visit: Payer: Self-pay

## 2019-03-02 ENCOUNTER — Ambulatory Visit: Payer: PRIVATE HEALTH INSURANCE | Attending: Nurse Practitioner | Admitting: Nurse Practitioner

## 2019-03-02 ENCOUNTER — Encounter: Payer: Self-pay | Admitting: Nurse Practitioner

## 2019-03-02 VITALS — BP 129/80 | HR 63 | Temp 98.7°F | Ht 63.0 in | Wt 124.6 lb

## 2019-03-02 DIAGNOSIS — J302 Other seasonal allergic rhinitis: Secondary | ICD-10-CM

## 2019-03-02 DIAGNOSIS — Z13228 Encounter for screening for other metabolic disorders: Secondary | ICD-10-CM

## 2019-03-02 DIAGNOSIS — G8929 Other chronic pain: Secondary | ICD-10-CM

## 2019-03-02 DIAGNOSIS — Z1211 Encounter for screening for malignant neoplasm of colon: Secondary | ICD-10-CM

## 2019-03-02 DIAGNOSIS — Z0000 Encounter for general adult medical examination without abnormal findings: Secondary | ICD-10-CM

## 2019-03-02 DIAGNOSIS — Z0001 Encounter for general adult medical examination with abnormal findings: Secondary | ICD-10-CM | POA: Diagnosis not present

## 2019-03-02 DIAGNOSIS — Z130 Encounter for screening for diseases of the blood and blood-forming organs and certain disorders involving the immune mechanism: Secondary | ICD-10-CM

## 2019-03-02 DIAGNOSIS — F17200 Nicotine dependence, unspecified, uncomplicated: Secondary | ICD-10-CM

## 2019-03-02 DIAGNOSIS — E559 Vitamin D deficiency, unspecified: Secondary | ICD-10-CM | POA: Diagnosis not present

## 2019-03-02 DIAGNOSIS — M25561 Pain in right knee: Secondary | ICD-10-CM | POA: Diagnosis not present

## 2019-03-02 DIAGNOSIS — F17210 Nicotine dependence, cigarettes, uncomplicated: Secondary | ICD-10-CM | POA: Diagnosis not present

## 2019-03-02 DIAGNOSIS — Z13220 Encounter for screening for lipoid disorders: Secondary | ICD-10-CM

## 2019-03-02 MED ORDER — FLUTICASONE PROPIONATE 50 MCG/ACT NA SUSP: 2.0000 | Freq: Every day | NASAL | 6 refills | Status: AC

## 2019-03-02 MED ORDER — DICLOFENAC SODIUM 1 % EX GEL: 2.0000 g | Freq: Four times a day (QID) | CUTANEOUS | 2 refills | Status: AC

## 2019-03-02 NOTE — Progress Notes (Signed)
Assessment & Plan:  Krystal Rogers was seen today for annual exam.  Diagnoses and all orders for this visit:  Well woman exam without gynecological exam  Chronic pain of right knee -     diclofenac Sodium (VOLTAREN) 1 % GEL; Apply 2 g topically 4 (four) times daily.  May alternate with heat and ice application for pain relief. May use x tylenol as prescribed pain relief. Other alternatives include massage, acupuncture and water aerobics.  You must stay active and avoid a sedentary lifestyle.  Seasonal allergies -     fluticasone (FLONASE) 50 MCG/ACT nasal spray; Place 2 sprays into both nostrils daily.  Tobacco dependence Bailie was counseled on the dangers of tobacco use, and was advised to quit. Reviewed strategies to maximize success, including removing cigarettes and smoking materials from environment, stress management and support of family/friends as well as pharmacological alternatives including: Wellbutrin, Chantix, Nicotine patch, Nicotine gum or lozenges. Smoking cessation support: smoking cessation hotline: 1-800-QUIT-NOW.  Smoking cessation classes are also available through [facility]. Call [phone] or visit our website at [email or web address]   A total of 3 minutes was spent on counseling for smoking cessation and Shamyah is not ready to quit.   Vitamin D deficiency disease -     Vitamin D level  Lipid screening -     Lipid Panel  Screening for metabolic disorder -     [id number]  Screening for deficiency anemia -     CBC  Colon cancer screening -     Ambulatory referral to Gastroenterology    Patient has been counseled on age-appropriate routine health concerns for screening and prevention. These are reviewed and up-to-date. Referrals have been placed accordingly. Immunizations are up-to-date or declined.    Subjective:   Chief Complaint  Patient presents with  . Annual Exam    Pt. is here for a physical.    HPI Krystal Rogers  58 y.o. female presents to office today for annual physical.     Review of Systems  Constitutional: Negative.  Negative for chills, fever, malaise/fatigue and weight loss.  Respiratory: Negative.  Negative for cough, sputum production, shortness of breath and wheezing.   Cardiovascular: Negative.  Negative for chest pain and leg swelling.  Gastrointestinal: Negative.  Negative for abdominal pain, blood in stool, constipation, diarrhea, heartburn, melena, nausea and vomiting.  Genitourinary: Negative.   Musculoskeletal: Positive for joint pain.  Skin: Negative.  Negative for rash.  Neurological: Negative.  Negative for dizziness, tremors, speech change, focal weakness, seizures and headaches.  Endo/Heme/Allergies: Positive for environmental allergies.  Psychiatric/Behavioral: Negative.  Negative for depression and suicidal ideas. The patient is not nervous/anxious and does not have insomnia.     History reviewed. No pertinent past medical history.  Past Surgical History:  Procedure Laterality Date  . ABDOMINAL HYSTERECTOMY      Family History  Problem Relation Age of Onset  . Cancer Father   . Cancer Brother     Social History Reviewed with no changes to be made today.   Outpatient Medications Prior to Visit  Medication Sig Dispense Refill  . cetirizine (ZYRTEC) 10 MG tablet Take 1 tablet (10 mg total) by mouth daily. 30 tablet 11  . fluticasone (FLONASE) 50 MCG/ACT nasal spray Place 2 sprays into both nostrils daily. 16 g 6  . benzonatate (TESSALON) 100 MG capsule Take 1 capsule (100 mg total) by mouth 2 (two) times daily as needed for cough. (Patient not taking: Reported on  03/02/2019) 20 capsule 0  . diclofenac sodium (VOLTAREN) 1 % GEL Apply 2 g topically 4 (four) times daily. (Patient not taking: Reported on 08/25/2018) 100 g 0   No facility-administered medications prior to visit.     Allergies  Allergen Reactions  . Madelaine Bhat Isothiocyanate] Hives        Objective:    BP 129/80 (BP Location: Right Arm, Patient Position: Sitting, Cuff Size: Normal)   Pulse 63   Temp 98.7 F (37.1 C) (Oral)   Ht 5' 3"  (1.6 m)   Wt 124 lb 9.6 oz (56.5 kg)   SpO2 97%   BMI 22.07 kg/m  Wt Readings from Last 3 Encounters:  03/02/19 124 lb 9.6 oz (56.5 kg)  11/03/17 114 lb (51.7 kg)  05/06/17 119 lb 6.4 oz (54.2 kg)    Physical Exam Constitutional:      Appearance: She is well-developed.  HENT:     Head: Normocephalic and atraumatic.     Right Ear: External ear normal.     Left Ear: External ear normal.     Nose: Nose normal.     Mouth/Throat:     Lips: Pink.     Mouth: Mucous membranes are moist.     Dentition: Abnormal dentition (full edentulous).     Tongue: No lesions.     Palate: No mass.     Pharynx: Oropharynx is clear. No oropharyngeal exudate.  Eyes:     General: Lids are normal. No scleral icterus.       Right eye: No discharge.     Extraocular Movements: Extraocular movements intact.     Conjunctiva/sclera: Conjunctivae normal.     Pupils: Pupils are equal, round, and reactive to light.  Neck:     Musculoskeletal: Normal range of motion and neck supple.     Thyroid: No thyromegaly.     Trachea: No tracheal deviation.  Cardiovascular:     Rate and Rhythm: Normal rate and regular rhythm.     Pulses:          Dorsalis pedis pulses are 2+ on the right side and 2+ on the left side.       Posterior tibial pulses are 2+ on the right side and 2+ on the left side.     Heart sounds: Normal heart sounds. No murmur. No friction rub.  Pulmonary:     Effort: Pulmonary effort is normal. No accessory muscle usage or respiratory distress.     Breath sounds: Normal breath sounds. No decreased breath sounds, wheezing, rhonchi or rales.  Chest:     Chest wall: No tenderness.     Breasts: Breasts are symmetrical.        Right: No inverted nipple, mass, nipple discharge, skin change or tenderness.        Left: No inverted nipple, mass, nipple  discharge, skin change or tenderness.  Abdominal:     General: Bowel sounds are normal. There is no distension.     Palpations: Abdomen is soft. There is no mass.     Tenderness: There is no abdominal tenderness. There is no guarding or rebound.  Musculoskeletal: Normal range of motion.        General: No tenderness or deformity.  Feet:     Right foot:     Protective Sensation: 10 sites tested. 10 sites sensed.     Skin integrity: No skin breakdown.     Toenail Condition: Right toenails are abnormally thick.     Left foot:  Protective Sensation: 10 sites tested. 10 sites sensed.     Skin integrity: No skin breakdown.     Toenail Condition: Left toenails are abnormally thick.  Lymphadenopathy:     Cervical: No cervical adenopathy.  Skin:    General: Skin is warm and dry.     Findings: No erythema.  Neurological:     Mental Status: She is alert and oriented to person, place, and time.     Cranial Nerves: Cranial nerves are intact. No cranial nerve deficit.     Sensory: Sensation is intact.     Motor: Motor function is intact.     Coordination: Coordination is intact. Coordination normal.     Gait: Gait is intact.     Deep Tendon Reflexes: Reflexes are normal and symmetric.  Psychiatric:        Attention and Perception: Attention and perception normal.        Mood and Affect: Mood and affect normal.        Speech: Speech normal.        Behavior: Behavior normal.        Thought Content: Thought content normal.        Cognition and Memory: Cognition and memory normal.        Judgment: Judgment normal.          Patient has been counseled extensively about nutrition and exercise as well as the importance of adherence with medications and regular follow-up. The patient was given clear instructions to go to ER or return to medical center if symptoms don't improve, worsen or new problems develop. The patient verbalized understanding.   Follow-up: Return in about 1 year (around  03/01/2020).   Gildardo Pounds, FNP-BC [facility], [place]   03/02/2019, 4:42 PM

## 2019-03-03 ENCOUNTER — Other Ambulatory Visit: Payer: Self-pay | Admitting: Nurse Practitioner

## 2019-03-03 LAB — CMP14+EGFR
ALT: 8 IU/L (ref 0–32)
AST: 19 IU/L (ref 0–40)
Albumin/Globulin Ratio: 1.5 (ref 1.2–2.2)
Albumin: 4.1 g/dL (ref 3.8–4.9)
Alkaline Phosphatase: 112 IU/L (ref 39–117)
BUN/Creatinine Ratio: 12 (ref 9–23)
BUN: 13 mg/dL (ref 6–24)
Bilirubin Total: 0.3 mg/dL (ref 0.0–1.2)
CO2: 26 mmol/L (ref 20–29)
Calcium: 9.3 mg/dL (ref 8.7–10.2)
Chloride: 104 mmol/L (ref 96–106)
Creatinine, Ser: 1.05 mg/dL — ABNORMAL HIGH (ref 0.57–1.00)
GFR calc Af Amer: 69 mL/min/{1.73_m2} (ref >59)
GFR calc non Af Amer: 60 mL/min/{1.73_m2} (ref >59)
Globulin, Total: 2.8 g/dL (ref 1.5–4.5)
Glucose: 81 mg/dL (ref 65–99)
Potassium: 4.3 mmol/L (ref 3.5–5.2)
Sodium: 142 mmol/L (ref 134–144)
Total Protein: 6.9 g/dL (ref 6.0–8.5)

## 2019-03-03 LAB — LIPID PANEL
Chol/HDL Ratio: 4 ratio (ref 0.0–4.4)
Cholesterol, Total: 196 mg/dL (ref 100–199)
HDL: 49 mg/dL (ref >39)
LDL Chol Calc (NIH): 129 mg/dL — ABNORMAL HIGH (ref 0–99)
Triglycerides: 98 mg/dL (ref 0–149)
VLDL Cholesterol Cal: 18 mg/dL (ref 5–40)

## 2019-03-03 LAB — CBC
Hematocrit: 41.8 % (ref 34.0–46.6)
Hemoglobin: 14.1 g/dL (ref 11.1–15.9)
MCH: 28.2 pg (ref 26.6–33.0)
MCHC: 33.7 g/dL (ref 31.5–35.7)
MCV: 84 fL (ref 79–97)
Platelets: 280 10*3/uL (ref 150–450)
RBC: 5 x10E6/uL (ref 3.77–5.28)
RDW: 13 % (ref 11.7–15.4)
WBC: 9.2 10*3/uL (ref 3.4–10.8)

## 2019-03-03 LAB — VITAMIN D 25 HYDROXY (VIT D DEFICIENCY, FRACTURES): Vit D, 25-Hydroxy: 14.9 ng/mL — ABNORMAL LOW (ref 30.0–100.0)

## 2019-03-03 MED ORDER — VITAMIN D (ERGOCALCIFEROL) 1.25 MG (50000 UNIT) PO CAPS: 50000.0000 [IU] | ORAL_CAPSULE | ORAL | 1 refills | Status: AC

## 2019-04-19 ENCOUNTER — Encounter: Payer: Self-pay | Admitting: Family Medicine

## 2020-09-25 ENCOUNTER — Other Ambulatory Visit: Payer: Self-pay | Admitting: Nurse Practitioner

## 2020-09-25 DIAGNOSIS — Z122 Encounter for screening for malignant neoplasm of respiratory organs: Secondary | ICD-10-CM

## 2020-10-13 ENCOUNTER — Inpatient Hospital Stay: Admission: RE | Admit: 2020-10-13 | Payer: PRIVATE HEALTH INSURANCE | Source: Ambulatory Visit

## 2020-10-17 ENCOUNTER — Ambulatory Visit: Payer: No Typology Code available for payment source | Attending: Internal Medicine | Admitting: Physical Therapy

## 2020-10-17 ENCOUNTER — Encounter: Payer: Self-pay | Admitting: Physical Therapy

## 2020-10-17 ENCOUNTER — Other Ambulatory Visit: Payer: Self-pay

## 2020-10-17 VITALS — BP 112/83

## 2020-10-17 DIAGNOSIS — R42 Dizziness and giddiness: Secondary | ICD-10-CM | POA: Insufficient documentation
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
## 2021-02-06 ENCOUNTER — Encounter (HOSPITAL_COMMUNITY): Payer: Self-pay

## 2021-02-06 ENCOUNTER — Ambulatory Visit (INDEPENDENT_AMBULATORY_CARE_PROVIDER_SITE_OTHER): Payer: No Typology Code available for payment source

## 2021-02-06 ENCOUNTER — Other Ambulatory Visit: Payer: Self-pay

## 2021-02-06 ENCOUNTER — Ambulatory Visit (HOSPITAL_COMMUNITY)
Admission: EM | Admit: 2021-02-06 | Discharge: 2021-02-06 | Disposition: A | Discharge status: Home / Self Care | Payer: No Typology Code available for payment source | Attending: Student | Admitting: Student

## 2021-02-06 DIAGNOSIS — S86912A Strain of unspecified muscle(s) and tendon(s) at lower leg level, left leg, initial encounter: Secondary | ICD-10-CM

## 2021-02-06 DIAGNOSIS — M25562 Pain in left knee: Secondary | ICD-10-CM

## 2021-02-06 DIAGNOSIS — S46812A Strain of other muscles, fascia and tendons at shoulder and upper arm level, left arm, initial encounter: Secondary | ICD-10-CM | POA: Diagnosis not present

## 2021-02-06 MED ORDER — DICLOFENAC SODIUM 1 % EX GEL: 4.0000 g | Freq: Four times a day (QID) | CUTANEOUS | 0 refills | Status: AC

## 2021-02-06 MED ORDER — IBUPROFEN 600 MG PO TABS: 600.0000 mg | ORAL_TABLET | Freq: Three times a day (TID) | ORAL | 0 refills | Status: AC | PRN

## 2021-02-06 MED ORDER — TIZANIDINE HCL 2 MG PO TABS: 2.0000 mg | ORAL_TABLET | Freq: Three times a day (TID) | ORAL | 0 refills | Status: AC | PRN

## 2021-02-06 NOTE — Discharge Instructions (Addendum)
-  Your knee xray looks good. You don't have any broken bones.  -Start the muscle relaxer-Zanaflex (tizanidine), up to 3 times daily for muscle spasms and pain.  This can make you drowsy, so take at bedtime or when you do not need to drive or operate machinery. -You can take Tylenol up to 1000 mg 3 times daily, and ibuprofen up to 600 mg 3 times daily with food.  You can take these together, or alternate every 3-4 hours. I sent a prescription for ibuprofen.  -Knee brace while standing and walking  -If symptoms persist follow-up with PCP for referral  -If symptoms persist in 7 days, follow-up with an orthopedist. I recommend [facility] at [address], [place], [place] [address]. You can schedule an appointment by calling [phone]) or online ([email or web address]), but they also have a walk-in clinic M-F 8a-8p and Sat 10a-3p.

## 2021-02-06 NOTE — ED Provider Notes (Signed)
[facility]    CSN: [id number] Arrival date & time: 02/06/21  1014      History   Chief Complaint Chief Complaint  Patient presents with   Knee Pain   Shoulder Pain    HPI Adelee Nakayla Rogers is a 60 y.o. female presenting with knee and shoulder pain for about 2 days.  Medical history noncontributory.  She does have an active job and endorses overuse, but denies trauma.  Describes left knee pain and stiffness following hitting the knee on furniture at home.  She is unable to recall exactly how the injury occurred or what furniture she hit the knee on.  States that the pain is worse over the medial aspect of the left knee, worse with extension and with ambulating.  Has tried icy hot with minimal improvement.  Denies radiation of the pain to calf or thigh.  Also with left shoulder pain for 2 days, worse with movement.  Denies radiation of pain to chest or down arm.  Tried to follow-up with her primary care provider but they could not see her.  HPI  History reviewed. No pertinent past medical history.  Patient Active Problem List   Diagnosis Date Noted   Tobacco dependence 05/06/2017    Past Surgical History:  Procedure Laterality Date   ABDOMINAL HYSTERECTOMY      OB History   No obstetric history on file.      Home Medications    Prior to Admission medications   Medication Sig Start Date End Date Taking? Authorizing Provider  diclofenac Sodium (VOLTAREN) 1 % GEL Apply 4 g topically 4 (four) times daily. 02/06/21  Yes Hazel Sams, PA-C  ibuprofen (ADVIL) 600 MG tablet Take 1 tablet (600 mg total) by mouth every 8 (eight) hours as needed. 02/06/21  Yes Hazel Sams, PA-C  tiZANidine (ZANAFLEX) 2 MG tablet Take 1 tablet (2 mg total) by mouth every 8 (eight) hours as needed for muscle spasms. 02/06/21  Yes Hazel Sams, PA-C  cetirizine (ZYRTEC) 10 MG tablet Take 1 tablet (10 mg total) by mouth daily. 08/25/18   Kerin Perna, NP  fluticasone  (FLONASE) 50 MCG/ACT nasal spray Place 2 sprays into both nostrils daily. 03/02/19   Gildardo Pounds, NP    Family History Family History  Problem Relation Age of Onset   Cancer Father    Cancer Brother     Social History Social History   Tobacco Use   Smoking status: Every Day    Packs/day: 0.50    Years: 20.00    Pack years: 10.00    Types: Cigarettes   Smokeless tobacco: Never  Substance Use Topics   Alcohol use: No   Drug use: No     Allergies   Mustard [allyl isothiocyanate]   Review of Systems Review of Systems  Musculoskeletal:        L shoulder pain L knee pain  All other systems reviewed and are negative.   Physical Exam Triage Vital Signs ED Triage Vitals  Enc Vitals Group     BP 02/06/21 1149 119/79     Pulse Rate 02/06/21 1149 80     Resp 02/06/21 1149 18     Temp 02/06/21 1149 98.4 F (36.9 C)     Temp Source 02/06/21 1149 Oral     SpO2 02/06/21 1149 96 %     Weight --      Height --      Head Circumference --  Peak Flow --      Pain Score 02/06/21 1150 8     Pain Loc --      Pain Edu? --      Excl. in [place]? --    No data found.  Updated Vital Signs BP 119/79 (BP Location: Left Arm)   Pulse 80   Temp 98.4 F (36.9 C) (Oral)   Resp 18   SpO2 96%   Visual Acuity Right Eye Distance:   Left Eye Distance:   Bilateral Distance:    Right Eye Near:   Left Eye Near:    Bilateral Near:     Physical Exam Vitals reviewed.  Constitutional:      General: She is not in acute distress.    Appearance: Normal appearance. She is not ill-appearing.  HENT:     Head: Normocephalic and atraumatic.  Cardiovascular:     Rate and Rhythm: Normal rate and regular rhythm.     Heart sounds: Normal heart sounds.  Pulmonary:     Effort: Pulmonary effort is normal.     Breath sounds: Normal breath sounds and air entry.  Abdominal:     Tenderness: There is no abdominal tenderness. There is no right CVA tenderness, left CVA tenderness, guarding  or rebound.  Musculoskeletal:     Cervical back: Normal range of motion. No swelling, deformity, signs of trauma, rigidity, spasms, tenderness, bony tenderness or crepitus. No pain with movement.     Thoracic back: No swelling, deformity, signs of trauma, spasms, tenderness or bony tenderness. Normal range of motion. No scoliosis.     Lumbar back: No swelling, deformity, signs of trauma, spasms, tenderness or bony tenderness. Normal range of motion. Negative right straight leg raise test and negative left straight leg raise test. No scoliosis.     Comments: Left proximal trapezius tenderness to palpation.  No pain with abduction left arm or cross body adduction.  Grip strength intact bilaterally.  No midline spinous tenderness, deformity, stepoff.  Left knee with mild effusion medially and point tenderness at the medial joint line.  Exam limited due to pain, extension is significantly limited.  No joint laxity.  Calves are equal and symmetric.  Absolutely no other injury, deformity, tenderness, ecchymosis, abrasion.  Neurological:     General: No focal deficit present.     Mental Status: She is alert.     Cranial Nerves: No cranial nerve deficit.  Psychiatric:        Mood and Affect: Mood normal.        Behavior: Behavior normal.        Thought Content: Thought content normal.        Judgment: Judgment normal.     UC Treatments / Results  Labs (all labs ordered are listed, but only abnormal results are displayed) Labs Reviewed - No data to display  EKG   Radiology DG Knee Complete 4 Views Left  Result Date: 02/06/2021 CLINICAL DATA:  Trauma 1 week ago with medial pain. EXAM: LEFT KNEE - COMPLETE 4+ VIEW COMPARISON:  None. FINDINGS: No evidence of fracture, dislocation, or joint effusion. No evidence of arthropathy or other focal bone abnormality. Soft tissues are unremarkable. IMPRESSION: Normal radiographs. Electronically Signed   By: Nelson Chimes M.D.   On: 02/06/2021 12:28     Procedures Procedures (including critical care time)  Medications Ordered in UC Medications - No data to display  Initial Impression / Assessment and Plan / UC Course  I have reviewed the triage vital  signs and the nursing notes.  Pertinent labs & imaging results that were available during my care of the patient were reviewed by me and considered in my medical decision making (see chart for details).     This patient is a very pleasant 60 y.o. year old female presenting with L knee strain/contusion and L proximal trapezius strain. No red flag symptoms.   Xray L knee- negative  Knee brace, voltaren gel, zanflex, ibuprofen 600mg . F/u with PCP/ ortho.   ED return precautions discussed. Patient verbalizes understanding and agreement.   Final Clinical Impressions(s) / UC Diagnoses   Final diagnoses:  Strain of left trapezius muscle, initial encounter  Knee strain, left, initial encounter     Discharge Instructions      -Your knee xray looks good. You don't have any broken bones.  -Start the muscle relaxer-Zanaflex (tizanidine), up to 3 times daily for muscle spasms and pain.  This can make you drowsy, so take at bedtime or when you do not need to drive or operate machinery. -You can take Tylenol up to 1000 mg 3 times daily, and ibuprofen up to 600 mg 3 times daily with food.  You can take these together, or alternate every 3-4 hours. I sent a prescription for ibuprofen.  -Knee brace while standing and walking  -If symptoms persist follow-up with PCP for referral  -If symptoms persist in 7 days, follow-up with an orthopedist. I recommend [facility] at [address], [place], [place] [address]. You can schedule an appointment by calling [phone]) or online ([email or web address]), but they also have a walk-in clinic M-F 8a-8p and Sat 10a-3p.    ED Prescriptions     Medication Sig Dispense Auth. Provider   tiZANidine (ZANAFLEX) 2 MG tablet Take 1 tablet (2 mg total) by  mouth every 8 (eight) hours as needed for muscle spasms. 21 tablet Hazel Sams, PA-C   ibuprofen (ADVIL) 600 MG tablet Take 1 tablet (600 mg total) by mouth every 8 (eight) hours as needed. 30 tablet Hazel Sams, PA-C   diclofenac Sodium (VOLTAREN) 1 % GEL Apply 4 g topically 4 (four) times daily. 100 g Hazel Sams, PA-C      PDMP not reviewed this encounter.   Hazel Sams, PA-C 02/06/21 1240

## 2021-02-06 NOTE — ED Triage Notes (Signed)
Pt c/o lt knee pain and popping when walking x2 days. Denies injury. States using icy hot, aleve, ice, and wearing a knee brace with no relief. Pt c/o lt shoulder spasms x2 days.

## 2021-10-03 ENCOUNTER — Encounter: Payer: Self-pay | Admitting: Nurse Practitioner

## 2021-10-16 ENCOUNTER — Other Ambulatory Visit: Payer: Self-pay | Admitting: Nurse Practitioner

## 2021-10-16 DIAGNOSIS — Z720 Tobacco use: Secondary | ICD-10-CM

## 2021-10-31 ENCOUNTER — Encounter: Payer: Self-pay | Admitting: Nurse Practitioner

## 2021-10-31 ENCOUNTER — Ambulatory Visit (INDEPENDENT_AMBULATORY_CARE_PROVIDER_SITE_OTHER): Payer: No Typology Code available for payment source | Admitting: Nurse Practitioner

## 2021-10-31 VITALS — BP 104/72 | HR 75 | Ht 63.0 in | Wt 139.0 lb

## 2021-10-31 DIAGNOSIS — Z1211 Encounter for screening for malignant neoplasm of colon: Secondary | ICD-10-CM

## 2021-10-31 NOTE — Progress Notes (Signed)
Agree with assessment and plan as outlined.  

## 2021-10-31 NOTE — Patient Instructions (Signed)
You have been scheduled for a colonoscopy. Please follow written instructions given to you at your visit today.  Please pick up your prep supplies at the pharmacy within the next 1-3 days. If you use inhalers (even only as needed), please bring them with you on the day of your procedure.   Due to recent changes in healthcare laws, you may see the results of your imaging and laboratory studies on MyChart before your provider has had a chance to review them.  We understand that in some cases there may be results that are confusing or concerning to you. Not all laboratory results come back in the same time frame and the provider may be waiting for multiple results in order to interpret others.  Please give [place] 48 hours in order for your provider to thoroughly review all the results before contacting the office for clarification of your results.    If you are age 36 or older, your body mass index should be between 23-30. Your Body mass index is 24.62 kg/m. If this is out of the aforementioned range listed, please consider follow up with your Primary Care Provider.  If you are age 67 or younger, your body mass index should be between 19-25. Your Body mass index is 24.62 kg/m. If this is out of the aformentioned range listed, please consider follow up with your Primary Care Provider.   ________________________________________________________  The [facility] providers would like to encourage you to use [facility] to communicate with providers for non-urgent requests or questions.  Due to long hold times on the telephone, sending your provider a message by [facility] may be a faster and more efficient way to get a response.  Please allow 48 business hours for a response.  Please remember that this is for non-urgent requests.  _______________________________________________________   I appreciate the  opportunity to care for you  Thank You   [place]

## 2021-10-31 NOTE — Progress Notes (Signed)
Chief Complaint:  none   Assessment & Plan   # 61 year old female for colon cancer screening.  No family history of colon cancer.  No alarm symptoms.  Schedule for a colonoscopy. The risks and benefits of colonoscopy with possible polypectomy / biopsies were discussed and the patient agrees to proceed.   # Tobacco abuse. She is down to 1/2 pack every couple of days. Plans to stop at some point.    HPI:    Patient is a 61 year old female with a past medical history significant for tobacco use, HLD. She is referred by PCP for colon cancer screening.   Last colonoscopy was 10 + years, she doesn't remember where it was done. No [place] of colon cancer. She has no blood in stool or bowel changes.   Previous Labs / Imaging::    Latest Ref Rng & Units 03/02/2019    4:15 PM 04/22/2017   11:02 AM  CBC  WBC 3.4 - 10.8 x10E3/uL 9.2  9.9   Hemoglobin 11.1 - 15.9 g/dL 14.1  14.7   Hematocrit 34.0 - 46.6 % 41.8  45.2   Platelets 150 - 450 x10E3/uL 280  249     No results found for: "LIPASE"    Latest Ref Rng & Units 03/02/2019    4:15 PM 04/22/2017   11:02 AM  CMP  Glucose 65 - 99 mg/dL 81  79   BUN 6 - 24 mg/dL 13  12   Creatinine 0.57 - 1.00 mg/dL 1.05  0.70   Sodium 134 - 144 mmol/L 142  142   Potassium 3.5 - 5.2 mmol/L 4.3  4.5   Chloride 96 - 106 mmol/L 104  105   CO2 20 - 29 mmol/L 26  22   Calcium 8.7 - 10.2 mg/dL 9.3  9.2   Total Protein 6.0 - 8.5 g/dL 6.9    Total Bilirubin 0.0 - 1.2 mg/dL 0.3    Alkaline Phos 39 - 117 IU/L 112    AST 0 - 40 IU/L 19    ALT 0 - 32 IU/L 8       Past Medical History:  Diagnosis Date   Arthritis    Past Surgical History:  Procedure Laterality Date   ABDOMINAL HYSTERECTOMY     APPENDECTOMY     Family History  Problem Relation Age of Onset   Cancer Father    Cancer Brother    Social History   Tobacco Use   Smoking status: Every Day    Packs/day: 0.50    Years: 20.00    Total pack years: 10.00    Types: Cigarettes    Smokeless tobacco: Never  Substance Use Topics   Alcohol use: No   Drug use: No   Current Outpatient Medications  Medication Sig Dispense Refill   naproxen sodium (ALEVE) 220 MG tablet Take 220 mg by mouth daily as needed.     cetirizine (ZYRTEC) 10 MG tablet Take 1 tablet (10 mg total) by mouth daily. (Patient not taking: Reported on 10/31/2021) 30 tablet 11   diclofenac Sodium (VOLTAREN) 1 % GEL Apply 4 g topically 4 (four) times daily. (Patient not taking: Reported on 10/31/2021) 100 g 0   fluticasone (FLONASE) 50 MCG/ACT nasal spray Place 2 sprays into both nostrils daily. (Patient not taking: Reported on 10/31/2021) 16 g 6   ibuprofen (ADVIL) 600 MG tablet Take 1 tablet (600 mg total) by mouth every 8 (eight) hours as needed. (Patient not taking: Reported on 10/31/2021) 30  tablet 0   tiZANidine (ZANAFLEX) 2 MG tablet Take 1 tablet (2 mg total) by mouth every 8 (eight) hours as needed for muscle spasms. (Patient not taking: Reported on 10/31/2021) 21 tablet 0   No current facility-administered medications for this visit.   Allergies  Allergen Reactions   Madelaine Bhat Isothiocyanate] Hives     Review of Systems: Positive for arthritis, allergy, headaches, itching, muscle pain and cramps.  All other systems reviewed and negative except where noted in HPI.   Wt Readings from Last 3 Encounters:  10/31/21 139 lb (63 kg)  03/02/19 124 lb 9.6 oz (56.5 kg)  11/03/17 114 lb (51.7 kg)    Physical Exam   Ht '5\' 3"'$  (1.6 m)   Wt 139 lb (63 kg)   BMI 24.62 kg/m  Constitutional:  Generally well appearing female in no acute distress. Psychiatric: Pleasant. Normal mood and affect. Behavior is normal. EENT: Pupils normal.  Conjunctivae are normal. No scleral icterus. Neck supple.  Cardiovascular: Normal rate, regular rhythm. No edema Pulmonary/chest: Effort normal and breath sounds normal. No wheezing, rales or rhonchi. Abdominal: Soft, nondistended, nontender. Bowel sounds active  throughout. There are no masses palpable. No hepatomegaly. Neurological: Alert and oriented to person place and time. Skin: Skin is warm and dry. No rashes noted.  Tye Savoy, NP  10/31/2021, 10:30 AM  Cc:  Referring Provider Lily Peer, FNP

## 2021-11-13 ENCOUNTER — Encounter: Payer: Self-pay | Admitting: Gastroenterology

## 2021-11-13 ENCOUNTER — Other Ambulatory Visit: Payer: No Typology Code available for payment source

## 2021-11-20 ENCOUNTER — Ambulatory Visit (AMBULATORY_SURGERY_CENTER): Payer: No Typology Code available for payment source | Admitting: Gastroenterology

## 2021-11-20 ENCOUNTER — Encounter: Payer: Self-pay | Admitting: Gastroenterology

## 2021-11-20 VITALS — BP 110/62 | HR 60 | Temp 98.4°F | Resp 18 | Ht 63.0 in | Wt 139.0 lb

## 2021-11-20 DIAGNOSIS — K6289 Other specified diseases of anus and rectum: Secondary | ICD-10-CM | POA: Diagnosis not present

## 2021-11-20 DIAGNOSIS — K5730 Diverticulosis of large intestine without perforation or abscess without bleeding: Secondary | ICD-10-CM | POA: Diagnosis not present

## 2021-11-20 DIAGNOSIS — K620 Anal polyp: Secondary | ICD-10-CM | POA: Diagnosis not present

## 2021-11-20 DIAGNOSIS — D175 Benign lipomatous neoplasm of intra-abdominal organs: Secondary | ICD-10-CM | POA: Diagnosis not present

## 2021-11-20 DIAGNOSIS — Z1211 Encounter for screening for malignant neoplasm of colon: Secondary | ICD-10-CM | POA: Diagnosis present

## 2021-11-20 DIAGNOSIS — D127 Benign neoplasm of rectosigmoid junction: Secondary | ICD-10-CM

## 2021-11-20 MED ORDER — SODIUM CHLORIDE 0.9 % IV SOLN: 500.0000 mL | Freq: Once | INTRAVENOUS | Status: DC

## 2021-11-20 NOTE — Patient Instructions (Signed)
Read all of the handouts given to you by your recovery room nurse.  YOU HAD AN ENDOSCOPIC PROCEDURE TODAY AT [facility]:   Refer to the procedure report that was given to you for any specific questions about what was found during the examination.  If the procedure report does not answer your questions, please call your gastroenterologist to clarify.  If you requested that your care partner not be given the details of your procedure findings, then the procedure report has been included in a sealed envelope for you to review at your convenience later.  YOU SHOULD EXPECT: Some feelings of bloating in the abdomen. Passage of more gas than usual.  Walking can help get rid of the air that was put into your GI tract during the procedure and reduce the bloating. If you had a lower endoscopy (such as a colonoscopy or flexible sigmoidoscopy) you may notice spotting of blood in your stool or on the toilet paper. If you underwent a bowel prep for your procedure, you may not have a normal bowel movement for a few days.  Please Note:  You might notice some irritation and congestion in your nose or some drainage.  This is from the oxygen used during your procedure.  There is no need for concern and it should clear up in a day or so.  SYMPTOMS TO REPORT IMMEDIATELY:  Following lower endoscopy (colonoscopy or flexible sigmoidoscopy):  Excessive amounts of blood in the stool  Significant tenderness or worsening of abdominal pains  Swelling of the abdomen that is new, acute  Fever of 100F or higher   For urgent or emergent issues, a gastroenterologist can be reached at any hour by calling [phone]. Do not use MyChart messaging for urgent concerns.    DIET:  We do recommend a small meal at first, but then you may proceed to your regular diet.  Drink plenty of fluids but you should avoid alcoholic beverages for 24 hours.  Try to increase the fiber in your diet, and drink plenty of  water  ACTIVITY:  You should plan to take it easy for the rest of today and you should NOT DRIVE or use heavy machinery until tomorrow (because of the sedation medicines used during the test).    FOLLOW UP: Our staff will call the number listed on your records the next business day following your procedure.  We will call around 7:15- 8:00 am to check on you and address any questions or concerns that you may have regarding the information given to you following your procedure. If we do not reach you, we will leave a message.  If you develop any symptoms (ie: fever, flu-like symptoms, shortness of breath, cough etc.) before then, please call [phone].  If you test positive for Covid 19 in the 2 weeks post procedure, please call and report this information to [place].    If any biopsies were taken you will be contacted by phone or by letter within the next 1-3 weeks.  Please call us at [phone] if you have not heard about the biopsies in 3 weeks.    SIGNATURES/CONFIDENTIALITY: You and/or your care partner have signed paperwork which will be entered into your electronic medical record.  These signatures attest to the fact that that the information above on your After Visit Summary has been reviewed and is understood.  Full responsibility of the confidentiality of this discharge information lies with you and/or your care-partner.

## 2021-11-20 NOTE — Progress Notes (Signed)
Report to PACU, RN, vss, BBS= Clear.  

## 2021-11-20 NOTE — Progress Notes (Signed)
[facility] History and Physical   Primary Care Physician:  Gildardo Pounds, NP   Reason for Procedure:   Colon cancer screening  Plan:    colonoscopy     HPI: Krystal Rogers is a 61 y.o. female  here for colonoscopy screening - last exam > 10 years ago. Patient denies any bowel symptoms at this time. No family history of colon cancer known. Otherwise feels well without any cardiopulmonary symptoms.   I have discussed risks / benefits of anesthesia and endoscopic procedure with Krystal Rogers and they wish to proceed with the exams as outlined today.    Past Medical History:  Diagnosis Date   Arthritis     Past Surgical History:  Procedure Laterality Date   ABDOMINAL HYSTERECTOMY     APPENDECTOMY     COLONOSCOPY      Prior to Admission medications   Medication Sig Start Date End Date Taking? Authorizing Provider  naproxen sodium (ALEVE) 220 MG tablet Take 220 mg by mouth daily as needed.   Yes [provider]  aspirin 81 MG chewable tablet Chew 1 tablet by mouth daily. Patient not taking: Reported on 11/20/2021 10/19/20   [provider]  atorvastatin (LIPITOR) 10 MG tablet Take 1 tablet by mouth daily. Patient not taking: Reported on 11/20/2021 10/19/20   [provider]  carbamide peroxide (DEBROX) 6.5 % OTIC solution instill 10 drops into right ear by otic route 2 times per day 10/19/20   [provider]  celecoxib (CELEBREX) 100 MG capsule Take 100 mg by mouth 2 (two) times daily. Patient not taking: Reported on 11/20/2021 07/24/21   [provider]  cetirizine (ZYRTEC) 10 MG tablet Take 1 tablet (10 mg total) by mouth daily. Patient not taking: Reported on 10/31/2021 08/25/18   Kerin Perna, NP  clobetasol cream (TEMOVATE) 0.05 % Apply topically 2 (two) times daily. 11/20/20   [provider]  diclofenac Sodium (VOLTAREN) 1 % GEL Apply 4 g topically 4 (four) times daily. Patient not taking:  Reported on 10/31/2021 02/06/21   Hazel Sams, PA-C  fluticasone [facility]) 50 MCG/ACT nasal spray Place 2 sprays into both nostrils daily. Patient not taking: Reported on 10/31/2021 03/02/19   Gildardo Pounds, NP  ibuprofen (ADVIL) 600 MG tablet Take 1 tablet (600 mg total) by mouth every 8 (eight) hours as needed. Patient not taking: Reported on 10/31/2021 02/06/21   Hazel Sams, PA-C  levocetirizine (XYZAL) 5 MG tablet Take 1 tablet by mouth daily. 07/19/20   [provider]  tiZANidine (ZANAFLEX) 2 MG tablet Take 1 tablet (2 mg total) by mouth every 8 (eight) hours as needed for muscle spasms. Patient not taking: Reported on 10/31/2021 02/06/21   Hazel Sams, PA-C    Current Outpatient Medications  Medication Sig Dispense Refill   naproxen sodium (ALEVE) 220 MG tablet Take 220 mg by mouth daily as needed.     aspirin 81 MG chewable tablet Chew 1 tablet by mouth daily. (Patient not taking: Reported on 11/20/2021)     atorvastatin (LIPITOR) 10 MG tablet Take 1 tablet by mouth daily. (Patient not taking: Reported on 11/20/2021)     carbamide peroxide (DEBROX) 6.5 % OTIC solution instill 10 drops into right ear by otic route 2 times per day     celecoxib (CELEBREX) 100 MG capsule Take 100 mg by mouth 2 (two) times daily. (Patient not taking: Reported on 11/20/2021)     cetirizine (ZYRTEC) 10 MG  tablet Take 1 tablet (10 mg total) by mouth daily. (Patient not taking: Reported on 10/31/2021) 30 tablet 11   clobetasol cream (TEMOVATE) 0.05 % Apply topically 2 (two) times daily.     diclofenac Sodium (VOLTAREN) 1 % GEL Apply 4 g topically 4 (four) times daily. (Patient not taking: Reported on 10/31/2021) 100 g 0   fluticasone (FLONASE) 50 MCG/ACT nasal spray Place 2 sprays into both nostrils daily. (Patient not taking: Reported on 10/31/2021) 16 g 6   ibuprofen (ADVIL) 600 MG tablet Take 1 tablet (600 mg total) by mouth every 8 (eight) hours as needed. (Patient not taking: Reported on  10/31/2021) 30 tablet 0   levocetirizine (XYZAL) 5 MG tablet Take 1 tablet by mouth daily.     tiZANidine (ZANAFLEX) 2 MG tablet Take 1 tablet (2 mg total) by mouth every 8 (eight) hours as needed for muscle spasms. (Patient not taking: Reported on 10/31/2021) 21 tablet 0   Current Facility-Administered Medications  Medication Dose Route Frequency Provider Last Rate Last Admin   0.9 %  sodium chloride infusion  500 mL Intravenous Once Kordell Jafri, Carlota Raspberry, MD        Allergies as of 11/20/2021 - Review Complete 11/20/2021  Allergen Reaction Noted   Madelaine Bhat isothiocyanate] Hives 03/28/2017    Family History  Problem Relation Age of Onset   Cancer Father    Pancreatic cancer Father    Liver cancer Brother    Colon polyps Brother    Prostate cancer Brother    Clotting disorder Brother    Diabetes Paternal Aunt    Esophageal cancer Niece    Stomach cancer Neg Hx    Colon cancer Neg Hx    Rectal cancer Neg Hx     Social History   Socioeconomic History   Marital status: Married    Spouse name: Not on file   Number of children: 3   Years of education: Not on file   Highest education level: Not on file  Occupational History   Occupation: Games developer  Tobacco Use   Smoking status: Every Day    Packs/day: 0.50    Years: 20.00    Total pack years: 10.00    Types: Cigarettes   Smokeless tobacco: Never  Vaping Use   Vaping Use: Never used  Substance and Sexual Activity   Alcohol use: No   Drug use: No   Sexual activity: Yes  Other Topics Concern   Not on file  Social History Narrative   Not on file   Social Determinants of Health   Financial Resource Strain: Not on file  Food Insecurity: Not on file  Transportation Needs: Not on file  Physical Activity: Not on file  Stress: Not on file  Social Connections: Not on file  Intimate Partner Violence: Not on file    Review of Systems: All other review of systems negative except as mentioned in the  HPI.  Physical Exam: Vital signs BP 90/60   Pulse 89   Temp 98.4 F (36.9 C) (Temporal)   Ht '5\' 3"'$  (1.6 m)   Wt 139 lb (63 kg)   SpO2 97%   BMI 24.62 kg/m   General:   Alert,  Well-developed, pleasant and cooperative in NAD Lungs:  Clear throughout to auscultation.   Heart:  Regular rate and rhythm Abdomen:  Soft, nontender and nondistended.   Neuro/Psych:  Alert and cooperative. Normal mood and affect. A and O x 3  Jolly Mango, MD [facility]

## 2021-11-20 NOTE — Progress Notes (Signed)
Pt's states no medical or surgical changes since previsit or office visit. 

## 2021-11-20 NOTE — Op Note (Signed)
[name] Procedure Date: 11/20/2021 2:45 PM MRN: [id number] Endoscopist: Remo Lipps P. Havery Moros , MD Age: 61 Referring MD:  Date of Birth: 05-14-1960 Gender: Female Account #: [id number] Procedure:                Colonoscopy Indications:              Screening for colorectal malignant neoplasm Medicines:                Monitored Anesthesia Care Procedure:                Pre-Anesthesia Assessment:                           - Prior to the procedure, a History and Physical                            was performed, and patient medications and                            allergies were reviewed. The patient's tolerance of                            previous anesthesia was also reviewed. The risks                            and benefits of the procedure and the sedation                            options and risks were discussed with the patient.                            All questions were answered, and informed consent                            was obtained. Prior Anticoagulants: The patient has                            taken no previous anticoagulant or antiplatelet                            agents. ASA Grade Assessment: II - A patient with                            mild systemic disease. After reviewing the risks                            and benefits, the patient was deemed in                            satisfactory condition to undergo the procedure.                           After obtaining informed consent, the colonoscope  was passed under direct vision. Throughout the                            procedure, the patient's blood pressure, pulse, and                            oxygen saturations were monitored continuously. The                            Olympus PCF-H190DL ([id number]) Colonoscope was                            introduced through the anus and advanced to the the                            cecum,  identified by appendiceal orifice and                            ileocecal valve. The colonoscopy was performed                            without difficulty. The patient tolerated the                            procedure well. The quality of the bowel                            preparation was good. The ileocecal valve,                            appendiceal orifice, and rectum were photographed. Scope In: 2:54:12 PM Scope Out: 3:17:00 PM Scope Withdrawal Time: 0 hours 19 minutes 53 seconds  Total Procedure Duration: 0 hours 22 minutes 48 seconds  Findings:                 The perianal and digital rectal examinations were                            normal.                           Multiple small-mouthed diverticula were found in                            the right colon.                           There was a lipoma, in the proximal transverse                            colon.                           A 4 mm polyp was found in the recto-sigmoid colon.  The polyp was sessile. The polyp was removed with a                            cold snare. Resection and retrieval were complete.                           Anal papilla(e) were hypertrophied. Biopsies were                            taken with a cold forceps for histology to rule out                            AIN.                           A large amount of liquid stool was found in the                            entire colon, making visualization difficult.                            Lavage of the colon was performed using copious                            amounts of sterile water, resulting in clearance                            with adequate visualization. This process prolonged                            the exam                           The exam was otherwise without abnormality. Complications:            No immediate complications. Estimated blood loss:                            Minimal. Estimated  Blood Loss:     Estimated blood loss was minimal. Impression:               - Diverticulosis in the right colon.                           - Lipoma in the proximal transverse colon.                           - One 4 mm polyp at the recto-sigmoid colon,                            removed with a cold snare. Resected and retrieved.                           - Anal papilla(e) were hypertrophied. Biopsied to  rule out AIN                           - Stool in the entire examined colon which was                            lavaged successfully.                           - The examination was otherwise normal. Recommendation:           - Patient has a contact number available for                            emergencies. The signs and symptoms of potential                            delayed complications were discussed with the                            patient. Return to normal activities tomorrow.                            Written discharge instructions were provided to the                            patient.                           - Resume previous diet.                           - Continue present medications.                           - Await pathology results. Remo Lipps P. Taren Dymek, MD 11/20/2021 3:22:14 PM This report has been signed electronically.

## 2021-11-20 NOTE — Progress Notes (Signed)
Called to room to assist during endoscopic procedure.  Patient ID and intended procedure confirmed with present staff. Received instructions for my participation in the procedure from the performing physician.  

## 2021-11-21 ENCOUNTER — Telehealth: Payer: Self-pay

## 2021-11-21 NOTE — Telephone Encounter (Signed)
  Follow up Call-     11/20/2021    2:02 PM  Call back number  Post procedure Call Back phone  # [phone]  Permission to leave phone message Yes     Patient questions:  Do you have a fever, pain , or abdominal swelling? No. Pain Score  0 *  Have you tolerated food without any problems? Yes.    Have you been able to return to your normal activities? Yes.    Do you have any questions about your discharge instructions: Diet   No. Medications  No. Follow up visit  No.  Do you have questions or concerns about your Care? No.  Actions: * If pain score is 4 or above: No action needed, pain <4.

## 2021-11-27 ENCOUNTER — Encounter: Payer: Self-pay | Admitting: Gastroenterology

## 2022-03-05 IMAGING — DX DG KNEE COMPLETE 4+V*L*
4 series · 4 of 4 positions shown · non-contrast
Comparison: None.

CLINICAL DATA: Trauma 1 week ago with medial pain.

EXAM:
LEFT KNEE - COMPLETE 4+ VIEW

[knee ap]
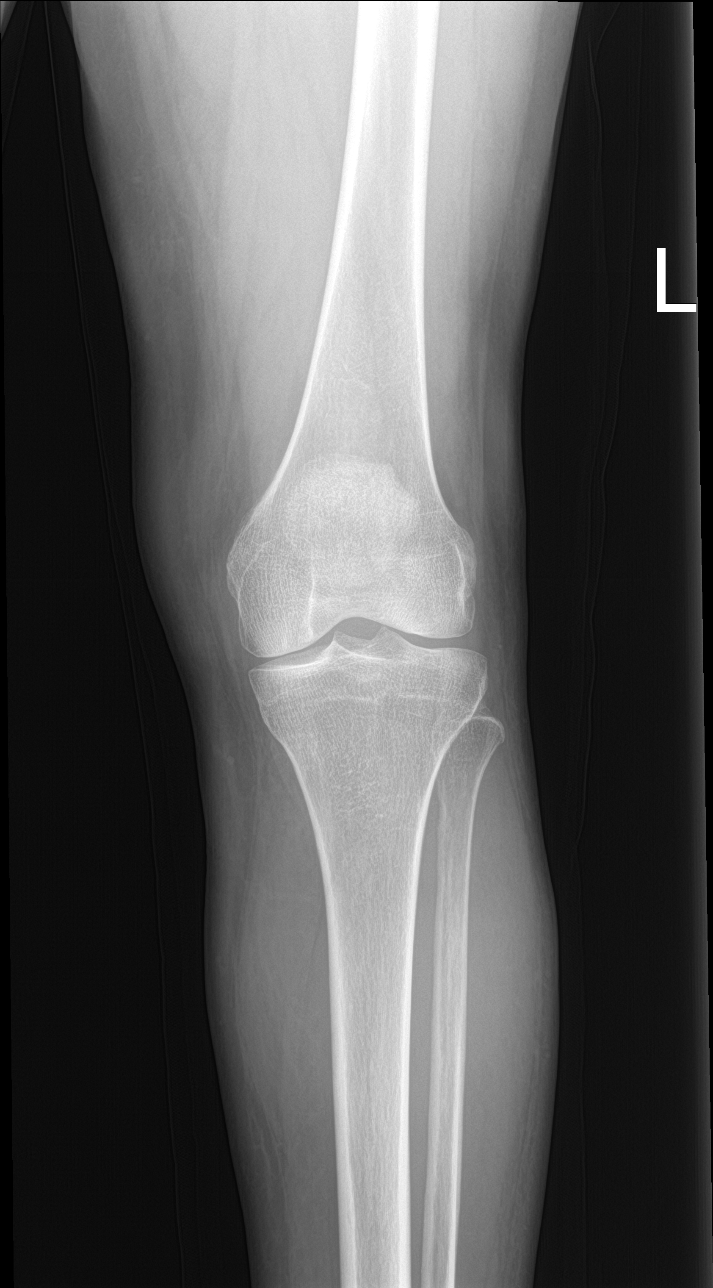

[knee obl (1 of 2)]
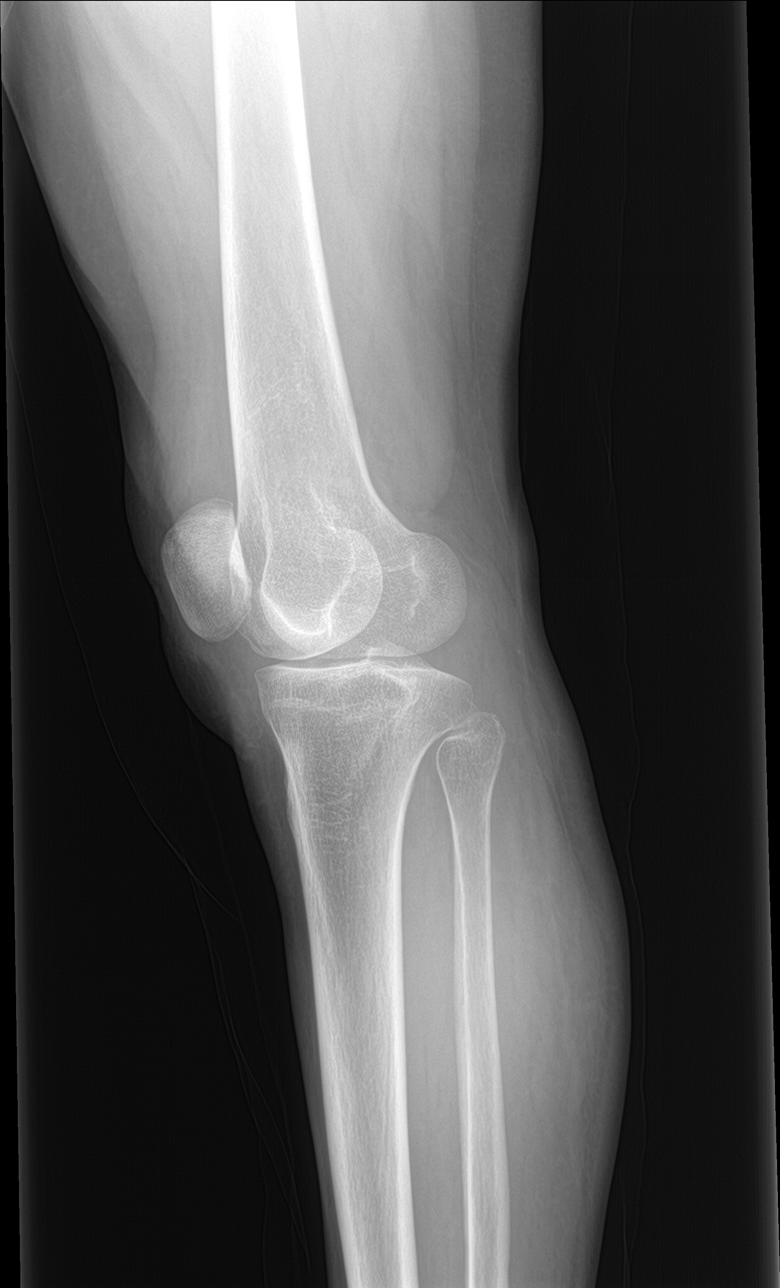

[knee obl (2 of 2)]
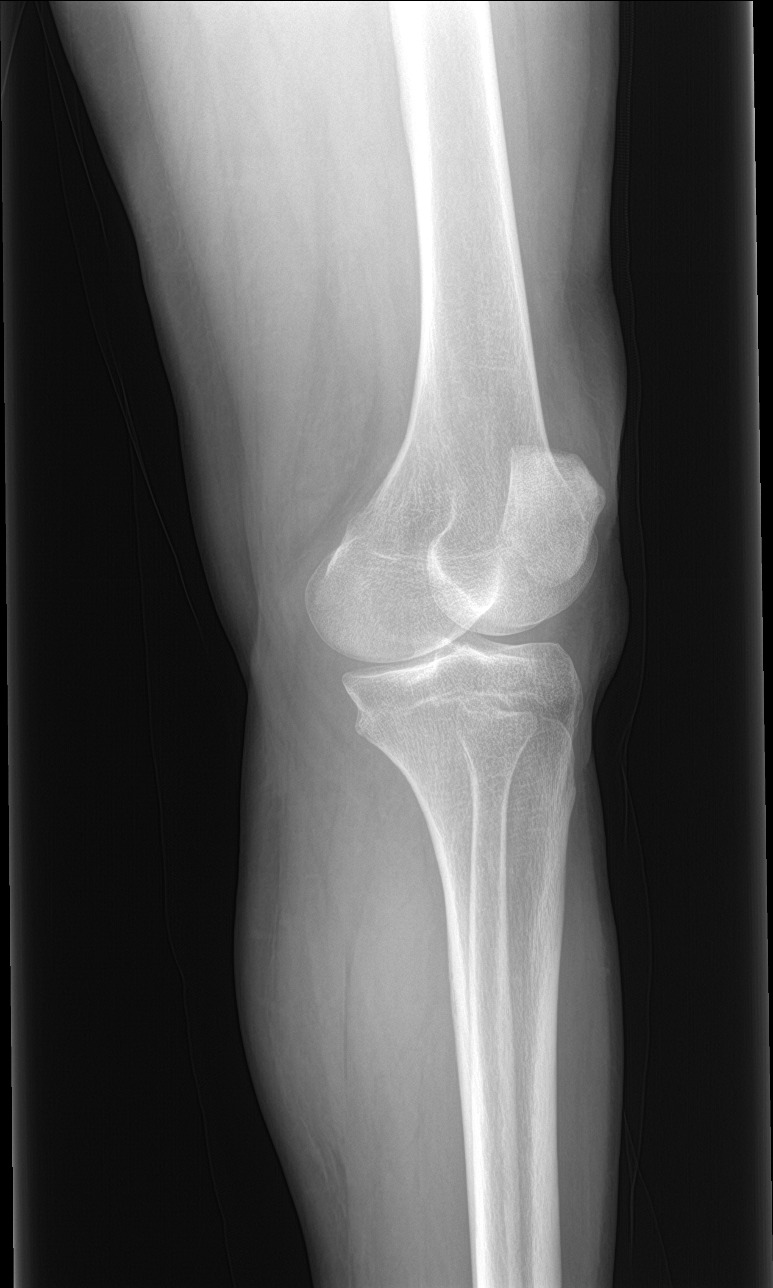

[knee lat]
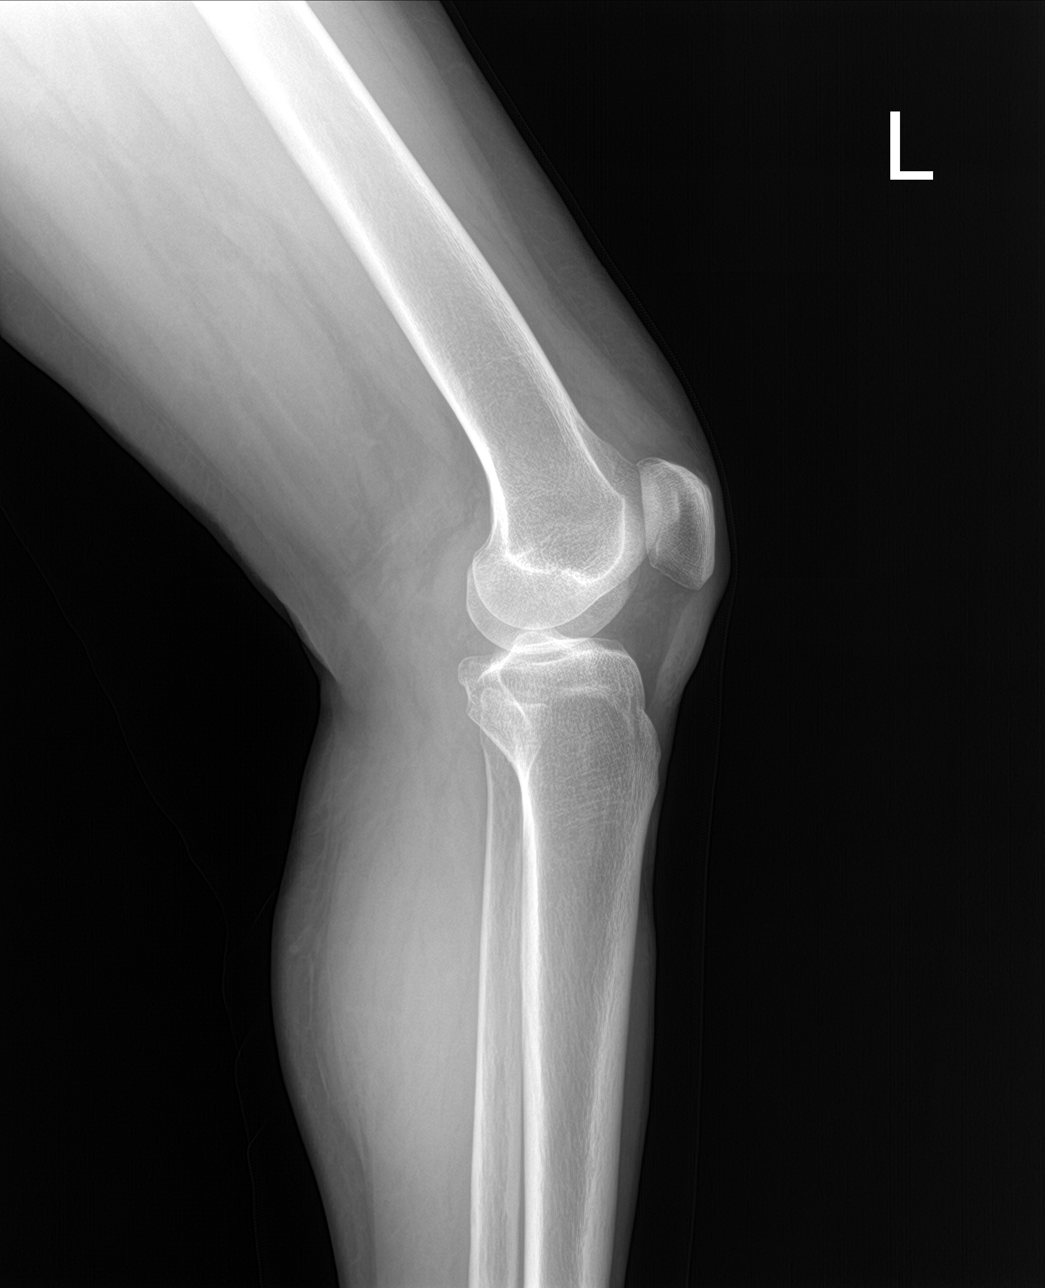

[4 of 4 positions shown; findings below may reference images not displayed]

FINDINGS: No evidence of fracture, dislocation, or joint effusion. No evidence
of arthropathy or other focal bone abnormality. Soft tissues are
unremarkable.
IMPRESSION: Normal radiographs.

## 2023-05-05 ENCOUNTER — Ambulatory Visit
Admission: RE | Admit: 2023-05-05 | Discharge: 2023-05-05 | Disposition: A | Discharge status: Home / Self Care | Payer: No Typology Code available for payment source | Source: Ambulatory Visit | Attending: Physician Assistant | Admitting: Physician Assistant

## 2023-05-05 ENCOUNTER — Other Ambulatory Visit: Payer: Self-pay | Admitting: Physician Assistant

## 2023-05-05 DIAGNOSIS — R059 Cough, unspecified: Secondary | ICD-10-CM

## 2023-08-04 ENCOUNTER — Other Ambulatory Visit: Payer: Self-pay | Admitting: Physician Assistant

## 2023-08-04 DIAGNOSIS — Z122 Encounter for screening for malignant neoplasm of respiratory organs: Secondary | ICD-10-CM

## 2023-08-04 DIAGNOSIS — Z720 Tobacco use: Secondary | ICD-10-CM

## 2023-08-13 ENCOUNTER — Ambulatory Visit: Admitting: Podiatry

## 2023-08-13 ENCOUNTER — Ambulatory Visit
Admission: RE | Admit: 2023-08-13 | Discharge: 2023-08-13 | Disposition: A | Discharge status: Home / Self Care | Source: Ambulatory Visit | Attending: Physician Assistant | Admitting: Physician Assistant

## 2023-08-13 DIAGNOSIS — Z720 Tobacco use: Secondary | ICD-10-CM

## 2023-08-13 DIAGNOSIS — Z122 Encounter for screening for malignant neoplasm of respiratory organs: Secondary | ICD-10-CM

## 2023-08-19 ENCOUNTER — Encounter: Payer: Self-pay | Admitting: Podiatry

## 2023-08-19 ENCOUNTER — Ambulatory Visit: Admitting: Podiatry

## 2023-08-19 ENCOUNTER — Ambulatory Visit (INDEPENDENT_AMBULATORY_CARE_PROVIDER_SITE_OTHER)

## 2023-08-19 VITALS — Ht 63.0 in | Wt 145.0 lb

## 2023-08-19 DIAGNOSIS — M67472 Ganglion, left ankle and foot: Secondary | ICD-10-CM

## 2023-08-19 DIAGNOSIS — L600 Ingrowing nail: Secondary | ICD-10-CM | POA: Diagnosis not present

## 2023-08-19 DIAGNOSIS — M67479 Ganglion, unspecified ankle and foot: Secondary | ICD-10-CM

## 2023-08-19 DIAGNOSIS — M722 Plantar fascial fibromatosis: Secondary | ICD-10-CM | POA: Diagnosis not present

## 2023-08-19 NOTE — Patient Instructions (Signed)
  VISIT SUMMARY: Today, you were seen for left foot pain due to a plantar fibroma, which is a benign growth on the bottom of your foot. You also discussed your ongoing issue with ingrown toenails on both big toes.  YOUR PLAN: -PLANTAR FIBROMA OF LEFT FOOT: A plantar fibroma is a non-cancerous growth in the arch of the foot. To help with the pain, it is recommended that you wear shoes with good cushioning and soft insoles, such as [place] insoles made of cork. You should also apply ice and massage the area to reduce swelling and soreness. If the pain becomes too much and affects your daily activities, we can consider a steroid injection. Keep an eye on the size of the fibroma; if it doubles in size, we will need to do further imaging tests like an MRI or ultrasound.  -INGROWN TOENAILS: Ingrown toenails occur when the edges of the toenail grow into the surrounding skin, causing pain and sometimes infection. You have been managing this with pedicures and self-care. If the discomfort increases, we can schedule an in-office procedure to remove the ingrown part of the nail and prevent it from coming back. This procedure involves numbing the area, removing the ingrown segment, and applying medication to stop it from growing back. Recovery usually takes two to four weeks.  INSTRUCTIONS: Please follow up if the pain from the plantar fibroma significantly impacts your daily activities or if the fibroma doubles in size. For the ingrown toenails, schedule the in-office procedure when the discomfort becomes too much to manage with self-care. Plan the procedure for mid-week to allow for healing over the weekend before returning to work.                      Contains text generated by Abridge.                                 Contains text generated by Abridge.

## 2023-08-21 NOTE — Progress Notes (Signed)
 Subjective:  Patient ID: Krystal Rogers, female    DOB: Jul 18, 1960,  MRN: [id number]  Chief Complaint  Patient presents with   Cyst    Rm#3 new patient-mass of left foot, under bottom middle of foot    Discussed the use of AI scribe software for clinical note transcription with the patient, who gave verbal consent to proceed.  History of Present Illness Krystal Rogers is a 63 year old female who presents with left foot pain due to a plantar fibroma.  She has had a growth under her left foot for about a year. Initially, it was not painful, but recently it has started to become sore. The soreness is a new development and was not present when the growth first appeared. The pain is localized to specific areas of the growth and is exacerbated by pressure from footwear, particularly steel-toed shoes. To mitigate discomfort, she buys her shoes a bit larger.  She denies having similar issues on her other foot or in her hands. She has been taking Aleve  for the pain, which provides some relief. She has not used any insoles in her shoes, as her workplace does not provide them, and she would need to purchase them herself.  In her family history, she is unsure if anyone else has had similar growths. However, she mentions a family history of ingrown toenails, which she attributes to her grandmother. She has experienced ingrown toenails on both big toes, which she manages by visiting a pedicure salon.  She works in a job that requires her to wear steel-toed shoes, which she believes might be contributing to the discomfort due to pressure.      Objective:    Physical Exam VASCULAR: DP and PT pulse palpable. Foot is warm and well-perfused. Capillary fill time is brisk. DERMATOLOGIC: Normal skin turgor, texture, and temperature. No open lesions, rashes, or ulcerations. NEUROLOGIC: Normal sensation to light touch and pressure. No paresthesias. ORTHOPEDIC: Smooth, pain-free range of motion of  all examined joints. No ecchymosis or bruising. No gross deformity. No pain to palpation. Small walnut-sized plantar fibroma in medial band of left plantar fascia, proximal to MTP joint. Thickened, incurvated left hallux nail.   No images are attached to the encounter.    Results RADIOLOGY Left foot X-ray: Normal, no calcifications of mass or bony lesions in area of concern   Assessment:   1. Plantar fibromatosis   2. Ingrowing right great toenail   3. Ingrowing left great toenail      Plan:  Patient was evaluated and treated and all questions answered.  Assessment and Plan Assessment & Plan Plantar fibroma of left foot Plantar fibroma located in the medial band of the plantar fascia, proximal to the MTP joint, present for approximately one year with recent onset of soreness. X-ray confirms no bone involvement. The fibroma is benign, with pain likely due to pressure from ambulation or inflammation. Treatment options include steroid injection for analgesia or surgical excision if necessary, though surgery is infrequent and carries a risk of recurrence. - Recommend footwear with adequate cushioning and soft insoles, such as [place] insoles made of cork, to alleviate pressure on the fibroma. - Advise application of ice and massage for edema and soreness. - Consider steroid injection if pain significantly impacts daily activities. - Monitor for rapid growth; if fibroma doubles in size, order MRI or ultrasound to evaluate for concerning changes.  Ingrown toenails Thickened, incurvated nails on both halluces, likely hereditary. She manages ingrown toenails with pedicures and  self-care. Discussed potential for in-office procedure to excise the ingrown portion and prevent recurrence, involving anesthesia, removal of the ingrown segment, and application of medication to inhibit regrowth. Recovery is expected to take two to four weeks. - Schedule in-office procedure for ingrown toenail excision  when pressure and discomfort escalate. - Plan procedure for mid-week to facilitate healing over the weekend before resuming work.      No follow-ups on file.

## 2023-09-30 ENCOUNTER — Other Ambulatory Visit (INDEPENDENT_AMBULATORY_CARE_PROVIDER_SITE_OTHER): Payer: Self-pay

## 2023-09-30 ENCOUNTER — Ambulatory Visit: Admitting: Orthopaedic Surgery

## 2023-09-30 DIAGNOSIS — G8929 Other chronic pain: Secondary | ICD-10-CM | POA: Diagnosis not present

## 2023-09-30 DIAGNOSIS — M25512 Pain in left shoulder: Secondary | ICD-10-CM

## 2023-09-30 MED ORDER — PREDNISONE 10 MG (21) PO TBPK: ORAL_TABLET | ORAL | 3 refills | Status: AC

## 2023-09-30 NOTE — Progress Notes (Signed)
 Office Visit Note   Patient: Krystal Rogers           Date of Birth: 25-Mar-1961           MRN: [id number] Visit Date: 09/30/2023              Requested by: Duwaine Annabella SAILOR, FNP [address] [place],  [place] [address] PCP: Buck Search, PA-C   Assessment & Plan: Visit Diagnoses:  1. Chronic left shoulder pain     Plan: History of Present Illness Krystal Rogers is a 63 year old female who presents with left shoulder pain and numbness.  She has experienced left shoulder pain, numbness, and tingling for two months. The pain is localized to the lateral shoulder and occurs with arm elevation, especially when reaching across her body. It sometimes disrupts her sleep. There is no neck pain. She denies recent injuries, falls, or trauma. No changes in activity levels are noted. The pain is aggravated by specific movements and is associated with tingling. No other symptoms are present.  Physical Exam MUSCULOSKELETAL: No impingement pain in left shoulder. Pain in left shoulder on resistance to cuff testing, passive movement, and cross-body adduction.  Results RADIOLOGY Left shoulder X-ray: No fracture or dislocation. Acromioclavicular joint arthritis. 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 Discussed treatment options including anti-inflammatory medication, home exercises, and possible cortisone injection. - Prescribe anti-inflammatory medication for 1-2 weeks. - Provide home exercises for shoulder. - Re-evaluate if symptoms persist after 6 weeks.  Rotator cuff inflammation Suspected inflammation without tear. Symptoms include pain and tingling, exacerbated by specific movements. Discussed MRI if no improvement after 6 weeks. - Prescribe anti-inflammatory medication for 1-2 weeks. - Provide home exercises for shoulder. - Consider MRI if symptoms do not improve after 6  weeks.  Follow-Up Instructions: No follow-ups on file.   Orders:  Orders Placed This Encounter  Procedures   XR Shoulder Left   Meds ordered this encounter  Medications   predniSONE (STERAPRED UNI-PAK 21 TAB) 10 MG (21) TBPK tablet    Sig: Take as directed    Dispense:  21 tablet    Refill:  3     Subjective: Chief Complaint  Patient presents with   Left Shoulder - Pain    HPI  Review of Systems  Constitutional: Negative.   HENT: Negative.    Eyes: Negative.   Respiratory: Negative.    Cardiovascular: Negative.   Endocrine: Negative.   Musculoskeletal: Negative.   Neurological: Negative.   Hematological: Negative.   Psychiatric/Behavioral: Negative.    All other systems reviewed and are negative.    Objective: Vital Signs: There were no vitals taken for this visit.  Physical Exam Vitals and nursing note reviewed.  Constitutional:      Appearance: She is well-developed.  HENT:     Head: Atraumatic.     Nose: Nose normal.   Eyes:     Extraocular Movements: Extraocular movements intact.    Cardiovascular:     Pulses: Normal pulses.  Pulmonary:     Effort: Pulmonary effort is normal.  Abdominal:     Palpations: Abdomen is soft.   Musculoskeletal:     Cervical back: Neck supple.   Skin:    General: Skin is warm.     Capillary Refill: Capillary refill takes less than 2 seconds.   Neurological:     Mental Status: She is alert. Mental status is at baseline.  Psychiatric:        Behavior: Behavior normal.        Thought Content: Thought content normal.        Judgment: Judgment normal.     Ortho Exam  Specialty Comments:  No specialty comments available.  Imaging: XR Shoulder Left Result Date: 09/30/2023 X-rays of the left shoulder show significant degenerative changes to the [facility] joint.  Otherwise no acute abnormalities.    PMFS History: Patient Active Problem List   Diagnosis Date Noted   Tobacco dependence 05/06/2017   Past  Medical History:  Diagnosis Date   Arthritis     Family History  Problem Relation Age of Onset   Cancer Father    Pancreatic cancer Father    Liver cancer Brother    Colon polyps Brother    Prostate cancer Brother    Clotting disorder Brother    Diabetes Paternal Aunt    Esophageal cancer Niece    Stomach cancer Neg Hx    Colon cancer Neg Hx    Rectal cancer Neg Hx     Past Surgical History:  Procedure Laterality Date   ABDOMINAL HYSTERECTOMY     APPENDECTOMY     COLONOSCOPY     Social History   Occupational History   Occupation: Museum/gallery exhibitions officer  Tobacco Use   Smoking status: Every Day    Current packs/day: 0.50    Average packs/day: 0.5 packs/day for 20.0 years (10.0 ttl pk-yrs)    Types: Cigarettes   Smokeless tobacco: Never  Vaping Use   Vaping status: Never Used  Substance and Sexual Activity   Alcohol use: No   Drug use: No   Sexual activity: Yes

## 2023-10-13 ENCOUNTER — Emergency Department (HOSPITAL_COMMUNITY)
Admission: EM | Admit: 2023-10-13 | Discharge: 2023-10-13 | Disposition: A | Discharge status: Home / Self Care | Attending: Emergency Medicine | Admitting: Emergency Medicine

## 2023-10-13 ENCOUNTER — Emergency Department (HOSPITAL_COMMUNITY)

## 2023-10-13 ENCOUNTER — Encounter (HOSPITAL_COMMUNITY): Payer: Self-pay

## 2023-10-13 ENCOUNTER — Other Ambulatory Visit: Payer: Self-pay

## 2023-10-13 DIAGNOSIS — D72829 Elevated white blood cell count, unspecified: Secondary | ICD-10-CM | POA: Insufficient documentation

## 2023-10-13 DIAGNOSIS — R112 Nausea with vomiting, unspecified: Secondary | ICD-10-CM | POA: Insufficient documentation

## 2023-10-13 DIAGNOSIS — R1084 Generalized abdominal pain: Secondary | ICD-10-CM | POA: Diagnosis not present

## 2023-10-13 LAB — COMPREHENSIVE METABOLIC PANEL WITH GFR
ALT: 15 U/L (ref 0–44)
AST: 20 U/L (ref 15–41)
Albumin: 3.5 g/dL (ref 3.5–5.0)
Alkaline Phosphatase: 78 U/L (ref 38–126)
Anion gap: 12 (ref 5–15)
BUN: 16 mg/dL (ref 8–23)
CO2: 21 mmol/L — ABNORMAL LOW (ref 22–32)
Calcium: 8.7 mg/dL — ABNORMAL LOW (ref 8.9–10.3)
Chloride: 106 mmol/L (ref 98–111)
Creatinine, Ser: 0.62 mg/dL (ref 0.44–1.00)
GFR, Estimated: >60 mL/min (ref >60)
Glucose, Bld: 137 mg/dL — ABNORMAL HIGH (ref 70–99)
Potassium: 3.6 mmol/L (ref 3.5–5.1)
Sodium: 139 mmol/L (ref 135–145)
Total Bilirubin: 0.8 mg/dL (ref 0.0–1.2)
Total Protein: 6.8 g/dL (ref 6.5–8.1)

## 2023-10-13 LAB — CBC
HCT: 41.1 % (ref 36.0–46.0)
Hemoglobin: 13.7 g/dL (ref 12.0–15.0)
MCH: 28.6 pg (ref 26.0–34.0)
MCHC: 33.3 g/dL (ref 30.0–36.0)
MCV: 85.8 fL (ref 80.0–100.0)
Platelets: 231 K/uL (ref 150–400)
RBC: 4.79 MIL/uL (ref 3.87–5.11)
RDW: 13.2 % (ref 11.5–15.5)
WBC: 13.2 K/uL — ABNORMAL HIGH (ref 4.0–10.5)
nRBC: 0 % (ref 0.0–0.2)

## 2023-10-13 LAB — LIPASE, BLOOD: Lipase: 29 U/L (ref 11–51)

## 2023-10-13 MED ORDER — DICYCLOMINE HCL 20 MG PO TABS: 20.0000 mg | ORAL_TABLET | Freq: Two times a day (BID) | ORAL | 0 refills | Status: AC

## 2023-10-13 MED ORDER — ONDANSETRON HCL 4 MG/2ML IJ SOLN
4.0000 mg | Freq: Once | INTRAMUSCULAR | Status: AC
  Administered 2023-10-13: 4 mg via INTRAVENOUS
  Filled 2023-10-13: qty 2

## 2023-10-13 MED ORDER — ONDANSETRON 4 MG PO TBDP: 4.0000 mg | ORAL_TABLET | Freq: Three times a day (TID) | ORAL | 0 refills | Status: AC | PRN

## 2023-10-13 MED ORDER — HYDROMORPHONE HCL 1 MG/ML IJ SOLN
1.0000 mg | Freq: Once | INTRAMUSCULAR | Status: AC
  Administered 2023-10-13: 1 mg via INTRAVENOUS
  Filled 2023-10-13: qty 1

## 2023-10-13 MED ORDER — IOHEXOL 350 MG/ML SOLN
75.0000 mL | Freq: Once | INTRAVENOUS | Status: AC | PRN
  Administered 2023-10-13: 75 mL via INTRAVENOUS

## 2023-10-13 MED ORDER — LACTATED RINGERS IV BOLUS
1000.0000 mL | Freq: Once | INTRAVENOUS | Status: AC
  Administered 2023-10-13: 1000 mL via INTRAVENOUS

## 2023-10-13 NOTE — ED Notes (Signed)
 Patient returned from CT

## 2023-10-13 NOTE — ED Provider Notes (Signed)
 [facility] Emergency Department Provider Note MRN:  [id number]  Arrival date & time: 10/13/23     Chief Complaint   Abdominal Pain   History of Present Illness   Krystal Rogers is a 63 y.o. year-old female presents to the ED with chief complaint of nausea, vomiting, abdominal pain.  States that the pain is primarily located in the upper abdomen.  Symptoms just started tonight.  Denies having experienced this before.    Denies any ETOH use.  Reports slight cough.  Denies fever.  Denies any urinary symptoms.  Tried some pepto before arrival, but it didn't stay down.  Prior appendectomy and hysterectomy.  History provided by patient.   Review of Systems  Pertinent positive and negative review of systems noted in HPI.    Physical Exam   Vitals:   10/13/23 0400 10/13/23 0415  BP: 110/65 119/87  Pulse: 71 65  Resp: 16 15  Temp:    SpO2: 100% 100%    CONSTITUTIONAL:  non toxic-appearing, NAD NEURO:  Alert and oriented x 3, CN 3-12 grossly intact EYES:  eyes equal and reactive ENT/NECK:  Supple, no stridor  CARDIO:  normal rate, regular rhythm, appears well-perfused  PULM:  No respiratory distress, CTAB GI/GU:  non-distended, generalized abdominal discomfort MSK/SPINE:  No gross deformities, no edema, moves all extremities  SKIN:  no rash, atraumatic   *Additional and/or pertinent findings included in MDM below  Diagnostic and Interventional Summary    EKG Interpretation Date/Time:    Ventricular Rate:    PR Interval:    QRS Duration:    QT Interval:    QTC Calculation:   R Axis:      Text Interpretation:         Labs Reviewed  COMPREHENSIVE METABOLIC PANEL WITH GFR - Abnormal; Notable for the following components:      Result Value   CO2 21 (*)    Glucose, Bld 137 (*)    Calcium 8.7 (*)    All other components within normal limits  CBC - Abnormal; Notable for the following components:   WBC 13.2 (*)    All other components  within normal limits  LIPASE, BLOOD  URINALYSIS, ROUTINE W REFLEX MICROSCOPIC    CT ABDOMEN PELVIS W CONTRAST  Final Result      Medications  HYDROmorphone  (DILAUDID ) injection 1 mg (1 mg Intravenous Given 10/13/23 0431)  ondansetron  (ZOFRAN ) injection 4 mg (4 mg Intravenous Given 10/13/23 0433)  lactated ringers  bolus 1,000 mL (1,000 mLs Intravenous New Bag/Given 10/13/23 0434)  iohexol  (OMNIPAQUE ) 350 MG/ML injection 75 mL (75 mLs Intravenous Contrast Given 10/13/23 0515)     Procedures  /  Critical Care Procedures  ED Course and Medical Decision Making  I have reviewed the triage vital signs, the nursing notes, and pertinent available records from the EMR.  Social Determinants Affecting Complexity of Care: Patient has no clinically significant social determinants affecting this chief complaint..   ED Course: Clinical Course as of 10/13/23 0643  Mon Oct 13, 2023  0641 Comprehensive metabolic panel(!) No significant electrolyte abnormality [RB]  0641 CBC(!) Mild leukocytosis, likely reactive [RB]  0641 CT ABDOMEN PELVIS W CONTRAST No obvious free air or fluid, radiology reports no acute intra-abdominal pathology. [RB]    Clinical Course User Index [RB] Vicky Charleston, PA-C    Medical Decision Making Patient here with nausea and vomiting and epigastric pain.  Symptoms started tonight.  She thinks that it might have been from eating  leftover [place] salad and hamburger.  She has not had any diarrhea.  Patient treated with IV fluid, pain medicine, and Zofran  in the ED.  On reassessment, she feels much better.  Vital signs are stable.  She is in no acute distress.  We discussed her labs and CT.  Will have her follow-up with her PCP.  Return to the ED for new or worsening symptoms.  Patient understands and agrees with the plan.  Amount and/or Complexity of Data Reviewed Labs: ordered. Decision-making details documented in ED Course. Radiology: ordered. Decision-making details  documented in ED Course.  Risk Prescription drug management.         Consultants: No consultations were needed in caring for this patient.   Treatment and Plan: I considered admission due to patient's initial presentation, but after considering the examination and diagnostic results, patient will not require admission and can be discharged with outpatient follow-up.    Final Clinical Impressions(s) / ED Diagnoses     ICD-10-CM   1. Generalized abdominal pain  R10.84     2. Nausea and vomiting, unspecified vomiting type  R11.2       ED Discharge Orders          Ordered    ondansetron  (ZOFRAN -ODT) 4 MG disintegrating tablet  Every 8 hours PRN        10/13/23 0639    dicyclomine  (BENTYL ) 20 MG tablet  2 times daily        10/13/23 9360              Discharge Instructions Discussed with and Provided to Patient:     Discharge Instructions      Your scan looked good.  Please return for new or worsening symptoms.  Take medications as prescribed.       Vicky Charleston, PA-C 10/13/23 [id number]    Nettie, April, MD 10/13/23 [phone]

## 2023-10-13 NOTE — ED Notes (Signed)
 Patient transported to CT

## 2023-10-13 NOTE — Discharge Instructions (Signed)
 Your scan looked good.  Please return for new or worsening symptoms.  Take medications as prescribed.

## 2023-10-13 NOTE — ED Triage Notes (Signed)
 Patient coming from home with chief complaint of upper abdominal pain. Patient reported to have eaten leftover pasta salad and a hamburger around noon for lunch and started to have symptoms around 2000 last night. Took pepto without relief. Leftovers were properly stored and from yesterday according to patient. With EMS patient had an episode of LOC for about 30 seconds. EMS VS 126/70 BP 78 HR 137 CBG 98% RA

## 2023-10-13 NOTE — ED Notes (Signed)
 Husband at the bedside.

## 2024-02-09 ENCOUNTER — Encounter: Payer: Self-pay | Admitting: Radiology
